# Patient Record
Sex: Female | Born: 1950
Health system: Southern US, Community
[De-identification: ages and names within clinical notes are randomized; demographics above are authoritative.]

## PROBLEM LIST (undated history)

## (undated) DIAGNOSIS — M5416 Radiculopathy, lumbar region: Secondary | ICD-10-CM

## (undated) DIAGNOSIS — F419 Anxiety disorder, unspecified: Secondary | ICD-10-CM

## (undated) DIAGNOSIS — I10 Essential (primary) hypertension: Secondary | ICD-10-CM

## (undated) DIAGNOSIS — G8929 Other chronic pain: Secondary | ICD-10-CM

## (undated) DIAGNOSIS — M549 Dorsalgia, unspecified: Secondary | ICD-10-CM

## (undated) DIAGNOSIS — M1990 Unspecified osteoarthritis, unspecified site: Secondary | ICD-10-CM

## (undated) DIAGNOSIS — E119 Type 2 diabetes mellitus without complications: Secondary | ICD-10-CM

## (undated) HISTORY — PX: ABDOMINAL HYSTERECTOMY: SHX81

---

## 2001-10-01 ENCOUNTER — Encounter: Payer: Self-pay | Admitting: Internal Medicine

## 2001-10-01 ENCOUNTER — Ambulatory Visit (HOSPITAL_COMMUNITY): Admission: RE | Admit: 2001-10-01 | Discharge: 2001-10-01 | Payer: Self-pay | Admitting: Internal Medicine

## 2002-02-14 ENCOUNTER — Ambulatory Visit (HOSPITAL_COMMUNITY): Admission: RE | Admit: 2002-02-14 | Discharge: 2002-02-14 | Payer: Self-pay | Admitting: Internal Medicine

## 2002-03-06 ENCOUNTER — Ambulatory Visit (HOSPITAL_COMMUNITY): Admission: RE | Admit: 2002-03-06 | Discharge: 2002-03-06 | Payer: Self-pay | Admitting: Internal Medicine

## 2002-04-03 ENCOUNTER — Ambulatory Visit (HOSPITAL_COMMUNITY): Admission: RE | Admit: 2002-04-03 | Discharge: 2002-04-03 | Payer: Self-pay | Admitting: Orthopaedic Surgery

## 2002-04-21 ENCOUNTER — Encounter (HOSPITAL_COMMUNITY): Admission: RE | Admit: 2002-04-21 | Discharge: 2002-05-21 | Payer: Self-pay | Admitting: Orthopaedic Surgery

## 2002-05-20 ENCOUNTER — Encounter (HOSPITAL_COMMUNITY): Admission: RE | Admit: 2002-05-20 | Discharge: 2002-06-19 | Payer: Self-pay | Admitting: Orthopaedic Surgery

## 2002-10-01 ENCOUNTER — Encounter: Payer: Self-pay | Admitting: Internal Medicine

## 2002-10-01 ENCOUNTER — Ambulatory Visit (HOSPITAL_COMMUNITY): Admission: RE | Admit: 2002-10-01 | Discharge: 2002-10-01 | Payer: Self-pay | Admitting: Internal Medicine

## 2003-03-06 ENCOUNTER — Encounter: Payer: Self-pay | Admitting: Internal Medicine

## 2003-03-06 ENCOUNTER — Ambulatory Visit (HOSPITAL_COMMUNITY): Admission: RE | Admit: 2003-03-06 | Discharge: 2003-03-06 | Payer: Self-pay | Admitting: Internal Medicine

## 2004-02-19 ENCOUNTER — Ambulatory Visit (HOSPITAL_COMMUNITY): Admission: RE | Admit: 2004-02-19 | Discharge: 2004-02-19 | Payer: Self-pay | Admitting: Internal Medicine

## 2005-02-19 ENCOUNTER — Emergency Department (HOSPITAL_COMMUNITY): Admission: EM | Admit: 2005-02-19 | Discharge: 2005-02-19 | Payer: Self-pay | Admitting: Emergency Medicine

## 2005-02-20 ENCOUNTER — Ambulatory Visit: Payer: Self-pay | Admitting: Orthopedic Surgery

## 2005-12-27 ENCOUNTER — Emergency Department (HOSPITAL_COMMUNITY): Admission: EM | Admit: 2005-12-27 | Discharge: 2005-12-27 | Payer: Self-pay | Admitting: Emergency Medicine

## 2006-06-17 ENCOUNTER — Emergency Department (HOSPITAL_COMMUNITY): Admission: EM | Admit: 2006-06-17 | Discharge: 2006-06-17 | Payer: Self-pay | Admitting: Emergency Medicine

## 2006-06-25 ENCOUNTER — Ambulatory Visit (HOSPITAL_COMMUNITY): Admission: RE | Admit: 2006-06-25 | Discharge: 2006-06-25 | Payer: Self-pay | Admitting: Internal Medicine

## 2007-06-11 ENCOUNTER — Ambulatory Visit (HOSPITAL_COMMUNITY): Admission: RE | Admit: 2007-06-11 | Discharge: 2007-06-11 | Payer: Self-pay | Admitting: Internal Medicine

## 2008-04-16 ENCOUNTER — Emergency Department (HOSPITAL_COMMUNITY): Admission: EM | Admit: 2008-04-16 | Discharge: 2008-04-16 | Payer: Self-pay | Admitting: Emergency Medicine

## 2008-04-23 ENCOUNTER — Encounter (HOSPITAL_COMMUNITY): Admission: RE | Admit: 2008-04-23 | Discharge: 2008-05-04 | Payer: Self-pay | Admitting: Internal Medicine

## 2008-06-11 ENCOUNTER — Ambulatory Visit (HOSPITAL_COMMUNITY): Admission: RE | Admit: 2008-06-11 | Discharge: 2008-06-11 | Payer: Self-pay | Admitting: Internal Medicine

## 2009-10-06 ENCOUNTER — Emergency Department (HOSPITAL_COMMUNITY): Admission: EM | Admit: 2009-10-06 | Discharge: 2009-10-06 | Payer: Self-pay | Admitting: Emergency Medicine

## 2009-10-20 ENCOUNTER — Ambulatory Visit (HOSPITAL_COMMUNITY): Admission: RE | Admit: 2009-10-20 | Discharge: 2009-10-20 | Payer: Self-pay | Admitting: Internal Medicine

## 2010-07-14 ENCOUNTER — Ambulatory Visit (HOSPITAL_COMMUNITY)
Admission: RE | Admit: 2010-07-14 | Discharge: 2010-07-14 | Payer: Self-pay | Source: Home / Self Care | Admitting: Internal Medicine

## 2010-07-16 ENCOUNTER — Encounter
Admission: RE | Admit: 2010-07-16 | Discharge: 2010-07-16 | Payer: Self-pay | Source: Home / Self Care | Attending: Internal Medicine | Admitting: Internal Medicine

## 2010-07-16 ENCOUNTER — Emergency Department (HOSPITAL_COMMUNITY)
Admission: EM | Admit: 2010-07-16 | Discharge: 2010-07-16 | Payer: Self-pay | Source: Home / Self Care | Admitting: Emergency Medicine

## 2010-08-07 HISTORY — PX: CARPAL TUNNEL RELEASE: SHX101

## 2010-12-20 NOTE — Procedures (Signed)
NAME:  Jocelyn Jackson, Jocelyn Jackson                 ACCOUNT NO.:  [id number]   MEDICAL RECORD NO.:  [id number]          PATIENT TYPE:  REC   LOCATION:  RAD                           FACILITY:  [facility]   PHYSICIAN:  Kingsley Callander. Ouida Sills, MD       DATE OF BIRTH:  1950-10-30   DATE OF PROCEDURE:  DATE OF DISCHARGE:                                  STRESS TEST   The patient underwent an Myoview stress test to evaluate recent symptoms  of chest pain.  She exercised 9 minutes (completing stage III of the  Bruce protocol) attaining a maximal heart rate of 176 (108% of the age  predicted maximal heart rate) at a workload of 10.1 METs and  discontinued exercise due to fatigue.  There are no symptoms of chest  pain.  There were no arrhythmias.  The baseline EKG revealed normal  sinus rhythm at 81 beats per minute.  She was hypertensive at 168/88 and  had a hypertensive response to exercise with a peak blood pressure of  238/92.  There were no ST-segment changes, diagnostic of ischemia.   IMPRESSION:  1. No evidence of exercise-induced ischemia.  Myoview images pending.  2. Resting hypertension.  3. Hypertensive exercise response.      Kingsley Callander. Ouida Sills, MD  Electronically Signed     ROF/MEDQ  D:  04/23/2008  T:  04/23/2008  Job:  [id number]

## 2010-12-23 NOTE — Op Note (Signed)
NAME:  TANISHA, LUTES                           ACCOUNT NO.:  [id number]   MEDICAL RECORD NO.:  [id number]                   PATIENT TYPE:  AMB   LOCATION:  DAY                                  FACILITY:  [facility]   PHYSICIAN:  J. Darreld Mclean, M.D.              DATE OF BIRTH:  04-04-1951   DATE OF PROCEDURE:  DATE OF DISCHARGE:                                 OPERATIVE REPORT   PREOPERATIVE DIAGNOSIS:  Carpal tunnel syndrome right.   POSTOPERATIVE DIAGNOSIS:  Carpal tunnel syndrome right.   PROCEDURE:  1. Open release carpal tunnel syndrome right.  2. Release of volar carpal ligament.  3. Saline neurolysis and epineurotomy.   SURGEON:  J. Darreld Mclean, M.D.   ASSISTANT:  Candace Cruise, [facility]   DRAINS:  None.   SPLINT:  Volar plastic splint applied.   INDICATIONS:  The patient is a 60 year old female who had a positive nerve  conduction velocities, EMG,  in 1999 in September.  At that time, carpal  tunnel right greater than left.  She elected not to have any surgery done.  She said that it was not that painful.  However, over the ensuing years it  has gotten progressively worse particularly over the last several months.  She is dropping things, and having significant night pain all over the  median nerve distribution.  Conservative treatment has been unsuccessful and  surgery is now recommended.  The risks and imponderables were discussed in  detail with the patient, she appears to understand and agreed to the  procedure as outlined.   DESCRIPTION OF PROCEDURE:  The patient placed supine on the operating room  table and Bier block anesthesia was given and table was attached.  The  patient was prepped and draped in the usual manner.  Prior to making an  incision we verified that this was Ms. Negash and we were doing the right  hand for carpal tunnel release.  Incision was made and careful dissection  and the nerves were identified proximally and distally and _________ nerve.   The volar carpal ligament was incised after a groove director placed in the  carpal tunnel space.  The patient's median nerve was obviously significantly  compressed.  Specimen of volar carpal ligament was sent to pathology.  Retinaculum cut proximally.  A saline neurolysis epineurotomy carried out.  No apparent injury to the nerve.  The wound then reapproximated using 3-0  nylon in interrupted vertical mattress manner.  A sterile dressing applied,  bulky dressing applied, sheet cotton applied.  Sheet cotton cut dorsally.  Volar plastic splint applied, and Ace  bandage applied loosely.  The patient was given a prescription for Darvocet-  N 100.  The patient returned in good condition.  If she has difficulty she  is to contact me through the office or hospital beeper system, otherwise see  me in my office in approximately 2 weeks.  Teola Bradley, M.D.    JWK/MEDQ  D:  04/03/2002  T:  04/04/2002  Job:  [id number]

## 2010-12-30 ENCOUNTER — Emergency Department (HOSPITAL_COMMUNITY)
Admission: EM | Admit: 2010-12-30 | Discharge: 2010-12-30 | Disposition: A | Discharge status: Home / Self Care | Payer: BC Managed Care – PPO | Attending: Emergency Medicine | Admitting: Emergency Medicine

## 2010-12-30 DIAGNOSIS — M5430 Sciatica, unspecified side: Secondary | ICD-10-CM | POA: Insufficient documentation

## 2010-12-30 DIAGNOSIS — Z79899 Other long term (current) drug therapy: Secondary | ICD-10-CM | POA: Insufficient documentation

## 2010-12-30 DIAGNOSIS — I10 Essential (primary) hypertension: Secondary | ICD-10-CM | POA: Insufficient documentation

## 2011-05-10 LAB — BASIC METABOLIC PANEL
Calcium: 9.2
Creatinine, Ser: 0.77
GFR calc Af Amer: >60
GFR calc non Af Amer: >60
Potassium: 2.9 — ABNORMAL LOW
Sodium: 138

## 2011-05-10 LAB — POCT CARDIAC MARKERS
CKMB, poc: 5.2
Myoglobin, poc: 82.1
Troponin i, poc: <0.05

## 2011-05-10 LAB — DIFFERENTIAL
Basophils Absolute: 0
Eosinophils Absolute: 0.1
Eosinophils Relative: 3
Lymphocytes Relative: 26
Monocytes Absolute: 0.5
Neutro Abs: 2.6

## 2011-05-10 LAB — CBC
HCT: 37.1
Hemoglobin: 13

## 2011-07-02 ENCOUNTER — Emergency Department (HOSPITAL_COMMUNITY): Payer: BC Managed Care – PPO

## 2011-07-02 ENCOUNTER — Emergency Department (HOSPITAL_COMMUNITY)
Admission: EM | Admit: 2011-07-02 | Discharge: 2011-07-02 | Disposition: A | Discharge status: Home / Self Care | Payer: BC Managed Care – PPO | Attending: Emergency Medicine | Admitting: Emergency Medicine

## 2011-07-02 DIAGNOSIS — J111 Influenza due to unidentified influenza virus with other respiratory manifestations: Secondary | ICD-10-CM | POA: Insufficient documentation

## 2011-07-02 DIAGNOSIS — Z9079 Acquired absence of other genital organ(s): Secondary | ICD-10-CM | POA: Insufficient documentation

## 2011-07-02 DIAGNOSIS — I10 Essential (primary) hypertension: Secondary | ICD-10-CM | POA: Insufficient documentation

## 2011-07-02 HISTORY — DX: Essential (primary) hypertension: I10

## 2011-07-02 MED ORDER — HYDROCOD POLST-CHLORPHEN POLST 10-8 MG/5ML PO LQCR
5.0000 mL | Freq: Once | ORAL | Status: AC
  Administered 2011-07-02: 5 mL via ORAL
  Filled 2011-07-02: qty 5

## 2011-07-02 MED ORDER — PREDNISONE 20 MG PO TABS
60.0000 mg | ORAL_TABLET | Freq: Once | ORAL | Status: AC
  Administered 2011-07-02: 60 mg via ORAL
  Filled 2011-07-02: qty 3

## 2011-07-02 MED ORDER — HYDROCOD POLST-CHLORPHEN POLST 10-8 MG/5ML PO LQCR: 5.0000 mL | Freq: Two times a day (BID) | ORAL | Status: DC

## 2011-07-02 MED ORDER — PREDNISONE 10 MG PO TABS: ORAL_TABLET | ORAL | Status: DC

## 2011-07-02 MED ORDER — ACETAMINOPHEN 500 MG PO TABS
1000.0000 mg | ORAL_TABLET | Freq: Once | ORAL | Status: AC
  Administered 2011-07-02: 1000 mg via ORAL
  Filled 2011-07-02: qty 2

## 2011-07-02 NOTE — ED Notes (Signed)
Pt presents with chills, headache, neck pain, sore throat and fever since 06/29/2011.

## 2011-07-03 NOTE — ED Provider Notes (Signed)
History     CSN: [id number] Arrival date & time: 07/02/2011  8:19 PM   First MD Initiated Contact with Patient 07/02/11 2021      Chief Complaint  Patient presents with  . Headache  . Neck Pain  . Chills    (Consider location/radiation/quality/duration/timing/severity/associated sxs/prior treatment) Patient is a 60 y.o. female presenting with headaches and neck pain. The history is provided by the patient.  Headache  The problem occurs constantly. The problem has not changed since onset.Associated with: fever. The pain is located in the frontal region. The quality of the pain is described as throbbing. The pain is moderate. The pain does not radiate. Associated symptoms include anorexia, a fever and malaise/fatigue. Pertinent negatives include no near-syncope, no palpitations, no syncope, no shortness of breath and no vomiting. Associated symptoms comments: Neck soreness. She has tried acetaminophen for the symptoms. The treatment provided no relief.  Neck Pain  Associated symptoms include headaches. Pertinent negatives include no photophobia, no chest pain and no syncope.    Past Medical History  Diagnosis Date  . Hypertension     Past Surgical History  Procedure Date  . Carpal tunnel release   . Abdominal hysterectomy     No family history on file.  History  Substance Use Topics  . Smoking status: Never Smoker   . Smokeless tobacco: Not on file  . Alcohol Use: No    OB History    Grav Para Term Preterm Abortions TAB SAB Ect Mult Living                  Review of Systems  Constitutional: Positive for fever and malaise/fatigue. Negative for activity change.       All ROS Neg except as noted in HPI  HENT: Positive for sore throat, neck pain and postnasal drip. Negative for nosebleeds.   Eyes: Negative for photophobia and discharge.  Respiratory: Negative for cough, shortness of breath and wheezing.   Cardiovascular: Negative for chest pain, palpitations, syncope  and near-syncope.  Gastrointestinal: Positive for anorexia. Negative for vomiting, abdominal pain and blood in stool.  Genitourinary: Negative for dysuria, frequency and hematuria.  Musculoskeletal: Positive for myalgias and arthralgias. Negative for back pain.  Skin: Negative.   Neurological: Positive for headaches. Negative for dizziness, seizures and speech difficulty.  Psychiatric/Behavioral: Negative for hallucinations and confusion.    Allergies  Review of patient's allergies indicates no known allergies.  Home Medications   Current Outpatient Rx  Name Route Sig Dispense Refill  . HYDROCOD POLST-CHLORPHEN POLST 10-8 MG/5ML PO LQCR Oral Take 5 mLs by mouth every 12 (twelve) hours. 140 mL 1  . PREDNISONE 10 MG PO TABS  6,5,4,3,2,1 - take with food 21 tablet 0    BP 166/72  Pulse 90  Temp(Src) 102.6 F (39.2 C) (Oral)  Resp 18  Ht 5\' 4"  (1.626 m)  Wt 130 lb (58.968 kg)  BMI 22.31 kg/m2  SpO2 100%  Physical Exam  Nursing note and vitals reviewed. Constitutional: She is oriented to person, place, and time. She appears well-developed and well-nourished.  Non-toxic appearance.  HENT:  Head: Normocephalic.  Right Ear: Tympanic membrane and external ear normal.  Left Ear: Tympanic membrane and external ear normal.       Nasal congestion present. Moderate increase redness of the posterior pharynx  Eyes: EOM and lids are normal. Pupils are equal, round, and reactive to light.  Neck: Normal range of motion. Neck supple. Carotid bruit is not present.  Soreness with ROM of the neck. No rigidity  Cardiovascular: Regular rhythm, normal heart sounds, intact distal pulses and normal pulses.  Tachycardia present.   Pulmonary/Chest: Breath sounds normal. No respiratory distress.  Abdominal: Soft. Bowel sounds are normal. There is no tenderness. There is no guarding.  Musculoskeletal: Normal range of motion.  Lymphadenopathy:       Head (right side): No submandibular adenopathy  present.       Head (left side): No submandibular adenopathy present.    She has no cervical adenopathy.  Neurological: She is alert and oriented to person, place, and time. She has normal strength. No cranial nerve deficit or sensory deficit.  Skin: Skin is warm and dry.  Psychiatric: She has a normal mood and affect. Her speech is normal.    ED Course  Procedures (including critical care time)   Labs Reviewed  RAPID STREP SCREEN   Dg Chest 2 View  07/02/2011  *RADIOLOGY REPORT*  Clinical Data:  Fever, cough and chills.  CHEST - 2 VIEW  Comparison: 04/16/2008  Findings: The heart size and mediastinal contours are within normal limits.  Both lungs are clear.  The visualized skeletal structures are unremarkable.  IMPRESSION: No active disease.  Original Report Authenticated By: Reola Calkins, M.D.     1. Influenza       MDM  I have reviewed nursing notes, vital signs, and all appropriate lab and imaging results for this patient.        Kathie Dike, [place] 07/03/11 [phone]

## 2011-07-03 NOTE — ED Provider Notes (Signed)
Medical screening examination/treatment/procedure(s) were performed by non-physician practitioner and as supervising physician I was immediately available for consultation/collaboration.   Hanley Seamen, MD 07/03/11 1500

## 2012-05-02 ENCOUNTER — Other Ambulatory Visit (HOSPITAL_COMMUNITY): Payer: Self-pay | Admitting: Internal Medicine

## 2012-05-02 DIAGNOSIS — Z139 Encounter for screening, unspecified: Secondary | ICD-10-CM

## 2012-05-07 ENCOUNTER — Ambulatory Visit (HOSPITAL_COMMUNITY)
Admission: RE | Admit: 2012-05-07 | Discharge: 2012-05-07 | Disposition: A | Discharge status: Home / Self Care | Payer: BC Managed Care – PPO | Source: Ambulatory Visit | Attending: Internal Medicine | Admitting: Internal Medicine

## 2012-05-07 DIAGNOSIS — Z139 Encounter for screening, unspecified: Secondary | ICD-10-CM

## 2012-05-07 DIAGNOSIS — Z1231 Encounter for screening mammogram for malignant neoplasm of breast: Secondary | ICD-10-CM | POA: Insufficient documentation

## 2013-06-24 ENCOUNTER — Other Ambulatory Visit (HOSPITAL_COMMUNITY): Payer: Self-pay | Admitting: Internal Medicine

## 2013-06-24 DIAGNOSIS — Z139 Encounter for screening, unspecified: Secondary | ICD-10-CM

## 2013-06-26 ENCOUNTER — Ambulatory Visit (HOSPITAL_COMMUNITY)
Admission: RE | Admit: 2013-06-26 | Discharge: 2013-06-26 | Disposition: A | Discharge status: Home / Self Care | Payer: BC Managed Care – PPO | Source: Ambulatory Visit | Attending: Internal Medicine | Admitting: Internal Medicine

## 2013-06-26 DIAGNOSIS — Z1231 Encounter for screening mammogram for malignant neoplasm of breast: Secondary | ICD-10-CM | POA: Insufficient documentation

## 2013-06-26 DIAGNOSIS — Z139 Encounter for screening, unspecified: Secondary | ICD-10-CM

## 2014-05-25 ENCOUNTER — Other Ambulatory Visit (HOSPITAL_COMMUNITY): Payer: Self-pay | Admitting: Internal Medicine

## 2014-05-25 DIAGNOSIS — Z1231 Encounter for screening mammogram for malignant neoplasm of breast: Secondary | ICD-10-CM

## 2014-06-29 ENCOUNTER — Ambulatory Visit (HOSPITAL_COMMUNITY)
Admission: RE | Admit: 2014-06-29 | Discharge: 2014-06-29 | Disposition: A | Discharge status: Home / Self Care | Payer: BC Managed Care – PPO | Source: Ambulatory Visit | Attending: Internal Medicine | Admitting: Internal Medicine

## 2014-06-29 DIAGNOSIS — Z1231 Encounter for screening mammogram for malignant neoplasm of breast: Secondary | ICD-10-CM | POA: Diagnosis not present

## 2014-12-07 ENCOUNTER — Ambulatory Visit: Payer: Self-pay | Admitting: Nutrition

## 2015-02-11 ENCOUNTER — Encounter (INDEPENDENT_AMBULATORY_CARE_PROVIDER_SITE_OTHER): Payer: Self-pay | Admitting: *Deleted

## 2015-02-17 ENCOUNTER — Other Ambulatory Visit (INDEPENDENT_AMBULATORY_CARE_PROVIDER_SITE_OTHER): Payer: Self-pay | Admitting: *Deleted

## 2015-02-17 ENCOUNTER — Encounter (INDEPENDENT_AMBULATORY_CARE_PROVIDER_SITE_OTHER): Payer: Self-pay | Admitting: *Deleted

## 2015-02-17 DIAGNOSIS — Z1211 Encounter for screening for malignant neoplasm of colon: Secondary | ICD-10-CM

## 2015-02-25 ENCOUNTER — Encounter: Payer: Self-pay | Admitting: Nutrition

## 2015-03-08 ENCOUNTER — Telehealth (INDEPENDENT_AMBULATORY_CARE_PROVIDER_SITE_OTHER): Payer: Self-pay | Admitting: *Deleted

## 2015-03-08 NOTE — Telephone Encounter (Signed)
Patient needs suprep 

## 2015-03-09 MED ORDER — SUPREP BOWEL PREP KIT 17.5-3.13-1.6 GM/177ML PO SOLN: 1.0000 | Freq: Once | ORAL | Status: DC

## 2015-04-06 ENCOUNTER — Telehealth (INDEPENDENT_AMBULATORY_CARE_PROVIDER_SITE_OTHER): Payer: Self-pay | Admitting: *Deleted

## 2015-04-06 NOTE — Telephone Encounter (Signed)
Referring MD/PCP: fagan   Procedure: tcs  Reason/Indication:  screening  Has patient had this procedure before?  Yes, years ago  If so, when, by whom and where?    Is there a family history of colon cancer?  no  Who?  What age when diagnosed?    Is patient diabetic?   no      Does patient have prosthetic heart valve?  no  Do you have a pacemaker?  no  Has patient ever had endocarditis? no  Has patient had joint replacement within last 12 months?  no  Does patient tend to be constipated or take laxatives? sometime  Does patient have a history of alcohol/drug use?   Is patient on Coumadin, Plavix and/or Aspirin? no  Medications: amlodipine 10 mg daily, losartan 50 mg daily, hctz 25 mg daily, potassium 20 mg bid, ibuprofen 800 mg tid  Allergies: nkda  Medication Adjustment:   Procedure date & time: 05/06/15 at 830

## 2015-04-08 NOTE — Telephone Encounter (Signed)
agree

## 2015-05-06 ENCOUNTER — Encounter (HOSPITAL_COMMUNITY)
Admission: RE | Disposition: A | Discharge status: Home / Self Care | Payer: Self-pay | Source: Ambulatory Visit | Attending: Internal Medicine

## 2015-05-06 ENCOUNTER — Encounter (HOSPITAL_COMMUNITY): Payer: Self-pay | Admitting: *Deleted

## 2015-05-06 ENCOUNTER — Ambulatory Visit (HOSPITAL_COMMUNITY)
Admission: RE | Admit: 2015-05-06 | Discharge: 2015-05-06 | Disposition: A | Discharge status: Home / Self Care | Payer: BLUE CROSS/BLUE SHIELD | Source: Ambulatory Visit | Attending: Internal Medicine | Admitting: Internal Medicine

## 2015-05-06 DIAGNOSIS — Z79899 Other long term (current) drug therapy: Secondary | ICD-10-CM | POA: Insufficient documentation

## 2015-05-06 DIAGNOSIS — K5730 Diverticulosis of large intestine without perforation or abscess without bleeding: Secondary | ICD-10-CM | POA: Insufficient documentation

## 2015-05-06 DIAGNOSIS — Z87891 Personal history of nicotine dependence: Secondary | ICD-10-CM | POA: Insufficient documentation

## 2015-05-06 DIAGNOSIS — Z1211 Encounter for screening for malignant neoplasm of colon: Secondary | ICD-10-CM | POA: Diagnosis not present

## 2015-05-06 DIAGNOSIS — I10 Essential (primary) hypertension: Secondary | ICD-10-CM | POA: Insufficient documentation

## 2015-05-06 HISTORY — PX: COLONOSCOPY: SHX5424

## 2015-05-06 SURGERY — COLONOSCOPY
Anesthesia: Moderate Sedation

## 2015-05-06 MED ORDER — MIDAZOLAM HCL 5 MG/5ML IJ SOLN
INTRAMUSCULAR | Status: AC
  Filled 2015-05-06: qty 10

## 2015-05-06 MED ORDER — SODIUM CHLORIDE 0.9 % IV SOLN
INTRAVENOUS | Status: DC
  Administered 2015-05-06: 08:00:00 via INTRAVENOUS

## 2015-05-06 MED ORDER — MEPERIDINE HCL 50 MG/ML IJ SOLN
INTRAMUSCULAR | Status: AC
  Filled 2015-05-06: qty 1

## 2015-05-06 MED ORDER — MIDAZOLAM HCL 5 MG/5ML IJ SOLN
INTRAMUSCULAR | Status: DC | PRN
  Administered 2015-05-06: 2 mg via INTRAVENOUS
  Administered 2015-05-06: 1 mg via INTRAVENOUS
  Administered 2015-05-06: 2 mg via INTRAVENOUS

## 2015-05-06 MED ORDER — STERILE WATER FOR IRRIGATION IR SOLN
Status: DC | PRN
  Administered 2015-05-06: 08:00:00

## 2015-05-06 MED ORDER — MEPERIDINE HCL 50 MG/ML IJ SOLN
INTRAMUSCULAR | Status: DC | PRN
  Administered 2015-05-06 (×2): 25 mg via INTRAVENOUS

## 2015-05-06 NOTE — H&P (Signed)
Jocelyn Jackson is an 64 y.o. female.   Chief Complaint: Patient is here for colonoscopy.Marland Kitchen HPI: Patient is 64 year old African-American female who is here for screening colonoscopy. She denies abdominal pain change in bowel habits or rectal bleeding. This is patient's first exam. Family History is negative for CRC.  Past Medical History  Diagnosis Date  . Hypertension     Past Surgical History  Procedure Laterality Date  . Carpal tunnel release    . Abdominal hysterectomy      History reviewed. No pertinent family history. Social History:  reports that she has quit smoking. Her smoking use included Cigarettes. She has a .125 pack-year smoking history. She does not have any smokeless tobacco history on file. She reports that she does not drink alcohol or use illicit drugs.  Allergies: No Known Allergies  Medications Prior to Admission  Medication Sig Dispense Refill  . hydrochlorothiazide (HYDRODIURIL) 25 MG tablet Take 25 mg by mouth daily.    Marland Kitchen ibuprofen (ADVIL,MOTRIN) 800 MG tablet Take 800 mg by mouth 3 (three) times daily as needed. pain    . losartan (COZAAR) 50 MG tablet Take 50 mg by mouth daily.    . potassium chloride SA (KLOR-CON M20) 20 MEQ tablet Take 20 mEq by mouth daily.    [place] Gunning BOWEL PREP SOLN Take 1 kit by mouth once. 1 Bottle 0  . chlorpheniramine-HYDROcodone (TUSSIONEX PENNKINETIC ER) 10-8 MG/5ML LQCR Take 5 mLs by mouth every 12 (twelve) hours. (Patient not taking: Reported on 04/26/2015) 140 mL 1  . predniSONE (DELTASONE) 10 MG tablet 6,5,4,3,2,1 - take with food (Patient not taking: Reported on 04/26/2015) 21 tablet 0    No results found for this or any previous visit (from the past 48 hour(s)). No results found.  ROS  Blood pressure 158/79, pulse 70, temperature 98.1 F (36.7 C), temperature source Oral, resp. rate 10, height '5\' 4"'  (1.626 m), weight 141 lb (63.957 kg), SpO2 100 %. Physical Exam  Constitutional: She appears well-developed and  well-nourished.  HENT:  Mouth/Throat: Oropharynx is clear and moist.  Eyes: Conjunctivae are normal. No scleral icterus.  Neck: No thyromegaly present.  Cardiovascular: Normal rate, regular rhythm and normal heart sounds.   No murmur heard. Respiratory: Effort normal and breath sounds normal.  GI: Soft. She exhibits no distension and no mass. There is no tenderness.  Musculoskeletal: She exhibits no edema.  Lymphadenopathy:    She has no cervical adenopathy.  Neurological: She is alert.  Skin: Skin is warm and dry.     Assessment/Plan Average risk screening colonoscopy.  REHMAN,NAJEEB U 05/06/2015, 8:22 AM

## 2015-05-06 NOTE — Op Note (Signed)
COLONOSCOPY PROCEDURE REPORT  PATIENT:  ALONNA BARTLING  MR#:  [id number] Birthdate:  07-11-1951, 64 y.o., female Endoscopist:  Dr. Malissa Hippo, MD Referred By:  Dr. Carylon Perches, MD Procedure Date: 05/06/2015  Procedure:   Colonoscopy  Indications:  Average risk screening colonoscopy. This is patient's first exam.  Informed Consent:  The procedure and risks were reviewed with the patient and informed consent was obtained.  Medications:  Demerol 50 mg IV Versed 5 mg IV  Description of procedure:  After a digital rectal exam was performed, that colonoscope was advanced from the anus through the rectum and colon to the area of the cecum, ileocecal valve and appendiceal orifice. The cecum was deeply intubated. These structures were well-seen and photographed for the record. From the level of the cecum and ileocecal valve, the scope was slowly and cautiously withdrawn. The mucosal surfaces were carefully surveyed utilizing scope tip to flexion to facilitate fold flattening as needed. The scope was pulled down into the rectum where a thorough exam including retroflexion was performed.  Findings:   Prep excellent. Two small diverticula noted at hepatic flexure. Normal rectal mucosa and anorectal junction.   Therapeutic/Diagnostic Maneuvers Performed:   None  Complications:  None  EBL: None  Cecal Withdrawal Time:  8 minutes  Impression:  Examination performed to cecum. Two small diverticula at hepatic flexure otherwise normal examination.  Recommendations:  Standard instructions given. Next screening exam in 10 years.  REHMAN,NAJEEB U  05/06/2015 8:59 AM  CC: Dr. Carylon Perches, MD & Dr. Bonnetta Barry ref. provider found

## 2015-05-06 NOTE — Discharge Instructions (Signed)
Resume usual medications and diet. °No driving for 24 hours. °Next screening exam in 10 years. ° ° °Colonoscopy, Care After °These instructions give you information on caring for yourself after your procedure. Your doctor may also give you more specific instructions. Call your doctor if you have any problems or questions after your procedure. °HOME CARE °· Do not drive for 24 hours. °· Do not sign important papers or use machinery for 24 hours. °· You may shower. °· You may go back to your usual activities, but go slower for the first 24 hours. °· Take rest breaks often during the first 24 hours. °· Walk around or use warm packs on your belly (abdomen) if you have belly cramping or gas. °· Drink enough fluids to keep your pee (urine) clear or pale yellow. °· Resume your normal diet. Avoid heavy or fried foods. °· Avoid drinking alcohol for 24 hours or as told by your doctor. °· Only take medicines as told by your doctor. °If a tissue sample (biopsy) was taken during the procedure:  °· Do not take aspirin or blood thinners for 7 days, or as told by your doctor. °· Do not drink alcohol for 7 days, or as told by your doctor. °· Eat soft foods for the first 24 hours. °GET HELP IF: °You still have a small amount of blood in your poop (stool) 2-3 days after the procedure. °GET HELP RIGHT AWAY IF: °· You have more than a small amount of blood in your poop. °· You see clumps of tissue (blood clots) in your poop. °· Your belly is puffy (swollen). °· You feel sick to your stomach (nauseous) or throw up (vomit). °· You have a fever. °· You have belly pain that gets worse and medicine does not help. °MAKE SURE YOU: °· Understand these instructions. °· Will watch your condition. °· Will get help right away if you are not doing well or get worse. °Document Released: 08/26/2010 Document Revised: 07/29/2013 Document Reviewed: 03/31/2013 °Exit[employer]® Patient Information ©2015 [employer]. This information is not intended to replace  advice given to you by your health care provider. Make sure you discuss any questions you have with your health care provider. ° °

## 2015-05-10 ENCOUNTER — Encounter (HOSPITAL_COMMUNITY): Payer: Self-pay | Admitting: Internal Medicine

## 2015-06-02 ENCOUNTER — Other Ambulatory Visit (HOSPITAL_COMMUNITY): Payer: Self-pay | Admitting: Internal Medicine

## 2015-06-02 DIAGNOSIS — Z1231 Encounter for screening mammogram for malignant neoplasm of breast: Secondary | ICD-10-CM

## 2015-07-05 ENCOUNTER — Ambulatory Visit (HOSPITAL_COMMUNITY)
Admission: RE | Admit: 2015-07-05 | Discharge: 2015-07-05 | Disposition: A | Discharge status: Home / Self Care | Payer: BLUE CROSS/BLUE SHIELD | Source: Ambulatory Visit | Attending: Internal Medicine | Admitting: Internal Medicine

## 2015-07-05 DIAGNOSIS — Z1231 Encounter for screening mammogram for malignant neoplasm of breast: Secondary | ICD-10-CM | POA: Insufficient documentation

## 2015-09-09 DIAGNOSIS — I10 Essential (primary) hypertension: Secondary | ICD-10-CM | POA: Diagnosis not present

## 2015-09-09 DIAGNOSIS — Z6827 Body mass index (BMI) 27.0-27.9, adult: Secondary | ICD-10-CM | POA: Diagnosis not present

## 2015-09-09 DIAGNOSIS — E119 Type 2 diabetes mellitus without complications: Secondary | ICD-10-CM | POA: Diagnosis not present

## 2015-09-13 ENCOUNTER — Encounter: Payer: Self-pay | Admitting: Obstetrics and Gynecology

## 2015-09-13 ENCOUNTER — Ambulatory Visit (INDEPENDENT_AMBULATORY_CARE_PROVIDER_SITE_OTHER): Payer: Medicare Other | Admitting: Obstetrics and Gynecology

## 2015-09-13 VITALS — BP 170/90 | Ht 64.0 in | Wt 142.5 lb

## 2015-09-13 DIAGNOSIS — Z90710 Acquired absence of both cervix and uterus: Secondary | ICD-10-CM | POA: Diagnosis not present

## 2015-09-13 DIAGNOSIS — Z0000 Encounter for general adult medical examination without abnormal findings: Secondary | ICD-10-CM

## 2015-09-13 NOTE — Progress Notes (Signed)
Patient ID: Jocelyn Jackson, female   DOB: 1951/06/02, 65 y.o.   MRN: [id number] Pt here today for annual exam. Pt denies any problems or concerns at this time.

## 2015-09-13 NOTE — Progress Notes (Signed)
Patient ID: Jocelyn Jackson, female   DOB: Jun 16, 1951, 65 y.o.   MRN: [id number]  Assessment:  Annual Gyn Exam 1. Normal exam statu post abd hysterectomy with removal of cervix, no future paps required Plan:   1. return prn 2.   Annual mammogram advised Subjective:  Jocelyn Jackson is a 65 y.o. female No obstetric history on file. who presents for annual exam. No LMP recorded. Patient has had a hysterectomy. The patient reports she has no complaints today. Pt confirms breast self-exam and denies any lumps or any other abnormalities. Has had colonoscopy 2016  The following portions of the patient's history were reviewed and updated as appropriate: allergies, current medications, past family history, past medical history, past social history, past surgical history and problem list. Past Medical History  Diagnosis Date  . Hypertension     Past Surgical History  Procedure Laterality Date  . Carpal tunnel release    . Abdominal hysterectomy    . Colonoscopy N/A 05/06/2015    Procedure: COLONOSCOPY;  Surgeon: Malissa Hippo, MD;  Location: [facility];  Service: Endoscopy;  Laterality: N/A;  830     Current outpatient prescriptions:  .  amLODipine (NORVASC) 10 MG tablet, Take 10 mg by mouth daily., Disp: , Rfl:  .  hydrochlorothiazide (HYDRODIURIL) 25 MG tablet, Take 25 mg by mouth daily., Disp: , Rfl:  .  ibuprofen (ADVIL,MOTRIN) 800 MG tablet, Take 800 mg by mouth 3 (three) times daily as needed. pain, Disp: , Rfl:  .  losartan (COZAAR) 50 MG tablet, Take 50 mg by mouth daily., Disp: , Rfl:  .  metFORMIN (GLUCOPHAGE) 500 MG tablet, , Disp: , Rfl: 11 .  potassium chloride SA (KLOR-CON M20) 20 MEQ tablet, Take 20 mEq by mouth daily., Disp: , Rfl:   Review of Systems Constitutional: negative followed by Dr Ouida Sills.who requested pt followup with Gyn . Gastrointestinal: negative Genitourinary: no bleeding pain or complaints. S  Objective:  BP 170/90 mmHg  Ht  (1.626 m)  Wt 142 lb 8  oz (64.638 kg)  BMI 24.45 kg/m2   BMI: Body mass index is 24.45 kg/(m^2).  General Appearance: Alert, appropriate appearance for age. No acute distress HEENT: Grossly normal ABD: Soft, no masses, non tender  Neck / Thyroid:  Cardiovascular: RRR; normal S1, S2, no murmur Lungs: CTA bilaterally Back: No CVAT Breast Exam: No dimpling, nipple retraction or discharge. No masses or nodes., Normal to inspection, Normal breast tissue bilaterally and No masses or nodes.No dimpling, nipple retraction or discharge. Gastrointestinal: Soft, non-tender, no masses or organomegaly Pelvic Exam: Vulva and vagina appear normal. Bimanual exam reveals normal uterus and adnexa. External genitalia: normal general appearance Urinary system: urethral meatus normal Vaginal: normal mucosa without prolapse or lesions, normal without tenderness, induration or masses and normal rugae Cervix: removed surgically Adnexa: non palpable Uterus: removed surgically Rectal: good sphincter tone, no masses and guaiac negative Rectovaginal: normal rectal, no masses Lymphatic Exam: Non-palpable nodes in neck, clavicular, axillary, or inguinal regions  Skin: no rash or abnormalities Neurologic: Normal gait and speech, no tremor  Psychiatric: Alert and oriented, appropriate affect.  Urinalysis:Not done  Christin Bach. MD Pgr [phone] 3:50 PM    By signing my name below, I, Marica Otter, attest that this documentation has been prepared under the direction and in the presence of Christin Bach, MD. Electronically Signed: Marica Otter, ED Scribe. 09/13/2015. 3:50 PM.   I personally performed the services described in this documentation, which was SCRIBED in  my presence. The recorded information has been reviewed and considered accurate. It has been edited as necessary during review. Jonnie Kind, MD

## 2015-10-15 ENCOUNTER — Emergency Department (HOSPITAL_COMMUNITY): Payer: Medicare Other

## 2015-10-15 ENCOUNTER — Emergency Department (HOSPITAL_COMMUNITY)
Admission: EM | Admit: 2015-10-15 | Discharge: 2015-10-15 | Disposition: A | Discharge status: Home / Self Care | Payer: Medicare Other | Attending: Emergency Medicine | Admitting: Emergency Medicine

## 2015-10-15 ENCOUNTER — Encounter (HOSPITAL_COMMUNITY): Payer: Self-pay | Admitting: Emergency Medicine

## 2015-10-15 DIAGNOSIS — I10 Essential (primary) hypertension: Secondary | ICD-10-CM | POA: Insufficient documentation

## 2015-10-15 DIAGNOSIS — M5412 Radiculopathy, cervical region: Secondary | ICD-10-CM | POA: Diagnosis not present

## 2015-10-15 DIAGNOSIS — Z79899 Other long term (current) drug therapy: Secondary | ICD-10-CM | POA: Insufficient documentation

## 2015-10-15 DIAGNOSIS — Z7984 Long term (current) use of oral hypoglycemic drugs: Secondary | ICD-10-CM | POA: Diagnosis not present

## 2015-10-15 DIAGNOSIS — M25512 Pain in left shoulder: Secondary | ICD-10-CM | POA: Diagnosis present

## 2015-10-15 DIAGNOSIS — Z87891 Personal history of nicotine dependence: Secondary | ICD-10-CM | POA: Diagnosis not present

## 2015-10-15 DIAGNOSIS — E119 Type 2 diabetes mellitus without complications: Secondary | ICD-10-CM | POA: Diagnosis not present

## 2015-10-15 HISTORY — DX: Type 2 diabetes mellitus without complications: E11.9

## 2015-10-15 HISTORY — DX: Other chronic pain: G89.29

## 2015-10-15 HISTORY — DX: Radiculopathy, lumbar region: M54.16

## 2015-10-15 HISTORY — DX: Dorsalgia, unspecified: M54.9

## 2015-10-15 MED ORDER — NAPROXEN 500 MG PO TABS: 500.0000 mg | ORAL_TABLET | Freq: Two times a day (BID) | ORAL | Status: DC

## 2015-10-15 MED ORDER — METHOCARBAMOL 500 MG PO TABS: 500.0000 mg | ORAL_TABLET | Freq: Four times a day (QID) | ORAL | Status: AC

## 2015-10-15 NOTE — ED Notes (Signed)
PA at bedside.

## 2015-10-15 NOTE — ED Provider Notes (Signed)
CSN: [id number]     Arrival date & time 10/15/15  4098 History  By signing my name below, I, Marica Otter, attest that this documentation has been prepared under the direction and in the presence of Burgess Amor, PA-C. Electronically Signed: Marica Otter, ED Scribe. 10/15/2015. 8:35 AM.  Chief Complaint  Patient presents with  . Shoulder Pain    left   The history is provided by the patient. No language interpreter was used.   PCP: Carylon Perches, MD HPI Comments: INDONESIA MCKEOUGH is a 65 y.o. female, with PMHx noted below, who presents to the Emergency Department complaining of sudden onset, atraumatic, 10/10, sore/stinging left shoulder pain radiating down her left arm and to the left side of her neck onset 3 weeks ago. Associated Sx include tingling sensation in left shoulder radiating down the left arm; pt denies any tingling in the hands. Aggravating factors include movement. Pt reports using advil and ibuprofen at home without relief. Pt denies any recent neck injuries. Pt further denies SOB, chest pain, n/v, diaphoresis, headache, weakness or any other Sx at this time.   Past Medical History  Diagnosis Date  . Hypertension   . Diabetes mellitus without complication (HCC)   . Chronic back pain   . Lumbar radiculopathy    Past Surgical History  Procedure Laterality Date  . Carpal tunnel release  2012    keeling  . Abdominal hysterectomy      cervix removed  . Colonoscopy N/A 05/06/2015    Procedure: COLONOSCOPY;  Surgeon: Malissa Hippo, MD;  Location: [facility];  Service: Endoscopy;  Laterality: N/A;  830   Family History  Problem Relation Age of Onset  . Heart disease Mother   . Hypertension Mother   . Diabetes Mother   . Heart disease Father   . Hypertension Father   . Diabetes Father   . Cancer Sister   . Hypertension Brother   . Hypertension Sister   . Hypertension Sister    Social History  Substance Use Topics  . Smoking status: Former Smoker -- 0.25 packs/day for  .5 years    Types: Cigarettes  . Smokeless tobacco: Never Used  . Alcohol Use: No   OB History    No data available     Review of Systems  Constitutional: Negative for diaphoresis, appetite change and fatigue.  HENT: Negative for congestion, ear discharge and sinus pressure.   Eyes: Negative for discharge.  Respiratory: Negative for cough and shortness of breath.   Cardiovascular: Negative for chest pain.  Gastrointestinal: Negative for nausea, vomiting, abdominal pain and diarrhea.  Genitourinary: Negative for frequency and hematuria.  Musculoskeletal: Positive for arthralgias (left shoulder pain radiating left arm pain) and neck pain (left sided ). Negative for back pain.  Skin: Negative for rash.  Neurological: Positive for numbness (tingling sensation in left arm). Negative for seizures, weakness and headaches.  Psychiatric/Behavioral: Negative for hallucinations.   Allergies  Review of patient's allergies indicates no known allergies.  Home Medications   Prior to Admission medications   Medication Sig Start Date End Date Taking? Authorizing Provider  amLODipine (NORVASC) 10 MG tablet Take 10 mg by mouth daily.    Historical Provider, MD  hydrochlorothiazide (HYDRODIURIL) 25 MG tablet Take 25 mg by mouth daily. 03/29/12   Historical Provider, MD  ibuprofen (ADVIL,MOTRIN) 800 MG tablet Take 800 mg by mouth 3 (three) times daily as needed. pain 07/25/13   Historical Provider, MD  losartan (COZAAR) 50 MG tablet Take  50 mg by mouth daily. 01/29/15   Historical Provider, MD  metFORMIN (GLUCOPHAGE) 500 MG tablet  09/10/15   Historical Provider, MD  methocarbamol (ROBAXIN) 500 MG tablet Take 1 tablet (500 mg total) by mouth 4 (four) times daily. 10/15/15 10/25/15  Burgess Amor, PA-C  naproxen (NAPROSYN) 500 MG tablet Take 1 tablet (500 mg total) by mouth 2 (two) times daily. 10/15/15   Burgess Amor, PA-C  potassium chloride SA (KLOR-CON M20) 20 MEQ tablet Take 20 mEq by mouth daily. 10/19/14    Historical Provider, MD   Triage Vitals: BP 137/73 mmHg  Pulse 89  Temp(Src) 98 F (36.7 C) (Oral)  Resp 16  Ht  (1.626 m)  Wt 142 lb (64.411 kg)  BMI 24.36 kg/m2  SpO2 100% Physical Exam  Constitutional: She is oriented to person, place, and time. She appears well-developed and well-nourished.  HENT:  Head: Normocephalic.  Eyes: EOM are normal.  Neck: Normal range of motion.  Cardiovascular:  Pulses:      Radial pulses are 2+ on the right side, and 2+ on the left side.  Pulmonary/Chest: Effort normal.  Abdominal: She exhibits no distension.  Musculoskeletal: Normal range of motion.       Cervical back: She exhibits no bony tenderness.  Left paracervical/trapezius muscle tenderness. No palpable spasm. FROM of Cspine without worsened pain or ridiculopathy.  Flex/ext with FROM of elbow and wrist.  Neurological: She is alert and oriented to person, place, and time.  Reflex Scores:      Bicep reflexes are 2+ on the right side and 1+ on the left side. Full grip strength, full and normal distal sensation in upper extremities.   Psychiatric: She has a normal mood and affect.  Nursing note and vitals reviewed.  ED Course  Procedures (including critical care time) DIAGNOSTIC STUDIES: Oxygen Saturation is 100% on ra, nl by my interpretation.    COORDINATION OF CARE: 8:17 AM: Discussed treatment plan which includes imaging, muscle relaxer, antiinflammatory, heating pad use, with pt at bedside; patient verbalizes understanding and agrees with treatment plan except states she is unable to use the heating pad because it makes her "want to pass out."   Imaging Review Dg Cervical Spine Complete  10/15/2015  CLINICAL DATA:  Left-sided neck pain into the left shoulder that radiates down the left arm. EXAM: CERVICAL SPINE - COMPLETE 4+ VIEW COMPARISON:  None. FINDINGS: Grade 1 anterolisthesis at C4-C5. Disc space narrowing at C5-C6. Normal alignment at the cervicothoracic junction.  Prevertebral soft tissues are normal. Bony encroachment of the right neural foramen at C5-C6 and C6-C7, this is most prominent at C6-C7. Bony encroachment of the left neural foramen at C3-C4 and C4-C5, most prominent at C4-C5. No evidence for a fracture. Evidence for bilateral facet arthropathy. IMPRESSION: Degenerative disease in the cervical spine. Bony encroachment of the neural foramen bilaterally. This is most prominent along the left side of C4-C5 and could be contributing to the patient's symptoms. Electronically Signed   By: Richarda Overlie M.D.   On: 10/15/2015 08:49   I have personally reviewed and evaluated these images as part of my medical decision-making.   MDM   Final diagnoses:  Cervical radiculopathy    Robaxin, ibuprofen, localized heat. followup with neurosurgery, referral given.    No neuro deficit on exam or by history to suggest emergent or surgical presentation, motor and strength intact.  Also discussed worsened sx that should prompt immediate re-evaluation including distal weakness.    I personally performed  the services described in this documentation, which was scribed in my presence. The recorded information has been reviewed and is accurate.   Adisa Vigeant, PA-C 10/15/15 0908  BurgesBurgess Amors AmorJulie Sheritta Deeg, PA-C 10/15/15 1134  Samuel JesterKathleen McManus, DO 10/18/15 [phone]

## 2015-10-15 NOTE — Discharge Instructions (Signed)

## 2015-10-15 NOTE — ED Notes (Signed)
C/o of left shoulder pain for 3 weeks.  Pain is constant, rates pain 10/10.  Denies any other pain at this time.

## 2015-10-22 DIAGNOSIS — M5023 Other cervical disc displacement, cervicothoracic region: Secondary | ICD-10-CM | POA: Diagnosis not present

## 2015-10-22 DIAGNOSIS — M5412 Radiculopathy, cervical region: Secondary | ICD-10-CM | POA: Diagnosis not present

## 2015-11-11 DIAGNOSIS — M5412 Radiculopathy, cervical region: Secondary | ICD-10-CM | POA: Diagnosis not present

## 2015-11-11 DIAGNOSIS — M5023 Other cervical disc displacement, cervicothoracic region: Secondary | ICD-10-CM | POA: Diagnosis not present

## 2015-11-11 DIAGNOSIS — M542 Cervicalgia: Secondary | ICD-10-CM | POA: Diagnosis not present

## 2015-11-12 ENCOUNTER — Other Ambulatory Visit: Payer: Self-pay | Admitting: Neurosurgery

## 2015-11-12 DIAGNOSIS — M5023 Other cervical disc displacement, cervicothoracic region: Secondary | ICD-10-CM

## 2015-11-23 ENCOUNTER — Ambulatory Visit
Admission: RE | Admit: 2015-11-23 | Discharge: 2015-11-23 | Disposition: A | Discharge status: Home / Self Care | Payer: Medicare Other | Source: Ambulatory Visit | Attending: Neurosurgery | Admitting: Neurosurgery

## 2015-11-23 DIAGNOSIS — M5023 Other cervical disc displacement, cervicothoracic region: Secondary | ICD-10-CM

## 2015-11-23 DIAGNOSIS — M50221 Other cervical disc displacement at C4-C5 level: Secondary | ICD-10-CM | POA: Diagnosis not present

## 2015-12-14 DIAGNOSIS — G959 Disease of spinal cord, unspecified: Secondary | ICD-10-CM | POA: Diagnosis not present

## 2015-12-17 ENCOUNTER — Other Ambulatory Visit: Payer: Self-pay | Admitting: Neurosurgery

## 2015-12-28 DIAGNOSIS — M542 Cervicalgia: Secondary | ICD-10-CM | POA: Diagnosis not present

## 2016-01-05 ENCOUNTER — Encounter (HOSPITAL_COMMUNITY)
Admission: RE | Admit: 2016-01-05 | Discharge: 2016-01-05 | Disposition: A | Discharge status: Home / Self Care | Payer: Medicare Other | Source: Ambulatory Visit | Attending: Neurosurgery | Admitting: Neurosurgery

## 2016-01-05 ENCOUNTER — Other Ambulatory Visit (HOSPITAL_COMMUNITY): Payer: Self-pay | Admitting: *Deleted

## 2016-01-05 ENCOUNTER — Emergency Department (HOSPITAL_COMMUNITY)
Admission: EM | Admit: 2016-01-05 | Discharge: 2016-01-05 | Disposition: A | Discharge status: Other Acute Inpatient Hospital | Payer: Medicare Other

## 2016-01-05 ENCOUNTER — Encounter (HOSPITAL_COMMUNITY): Payer: Self-pay

## 2016-01-05 DIAGNOSIS — G959 Disease of spinal cord, unspecified: Secondary | ICD-10-CM | POA: Insufficient documentation

## 2016-01-05 DIAGNOSIS — Z01818 Encounter for other preprocedural examination: Secondary | ICD-10-CM | POA: Diagnosis not present

## 2016-01-05 DIAGNOSIS — Z01812 Encounter for preprocedural laboratory examination: Secondary | ICD-10-CM | POA: Insufficient documentation

## 2016-01-05 HISTORY — DX: Unspecified osteoarthritis, unspecified site: M19.90

## 2016-01-05 HISTORY — DX: Anxiety disorder, unspecified: F41.9

## 2016-01-05 LAB — BASIC METABOLIC PANEL
ANION GAP: 6 (ref 5–15)
BUN: 9 mg/dL (ref 6–20)
CALCIUM: 9.6 mg/dL (ref 8.9–10.3)
CO2: 26 mmol/L (ref 22–32)
CREATININE: 0.74 mg/dL (ref 0.44–1.00)
Chloride: 106 mmol/L (ref 101–111)
GFR calc non Af Amer: >60 mL/min (ref >60)
GLUCOSE: 96 mg/dL (ref 65–99)
Potassium: 3.6 mmol/L (ref 3.5–5.1)
Sodium: 138 mmol/L (ref 135–145)

## 2016-01-05 LAB — CBC
HEMATOCRIT: 34.9 % — AB (ref 36.0–46.0)
Hemoglobin: 11.6 g/dL — ABNORMAL LOW (ref 12.0–15.0)
MCH: 27.6 pg (ref 26.0–34.0)
MCHC: 33.2 g/dL (ref 30.0–36.0)
MCV: 83.1 fL (ref 78.0–100.0)
Platelets: 249 10*3/uL (ref 150–400)
RBC: 4.2 MIL/uL (ref 3.87–5.11)
RDW: 13.3 % (ref 11.5–15.5)
WBC: 5.2 10*3/uL (ref 4.0–10.5)

## 2016-01-05 LAB — GLUCOSE, CAPILLARY: GLUCOSE-CAPILLARY: 105 mg/dL — AB (ref 65–99)

## 2016-01-05 LAB — SURGICAL PCR SCREEN
MRSA, PCR: NEGATIVE
STAPHYLOCOCCUS AUREUS: NEGATIVE

## 2016-01-05 NOTE — Pre-Procedure Instructions (Addendum)
Jocelyn Jackson  01/05/2016      [facility] - [place], [place] - [address] [place] [address] Phone: [phone] Fax: [phone]    Your procedure is scheduled on 01-12-2016  Wednesday    [facility] Ad[facility] at 11:00 A.M.   Call this number if you have problems the morning of surgery:  [phone]   Remember:  Do not eat food or drink liquids after midnight.   Take these medicines the morning of surgery with A SIP OF WATER amlodipine(Norvasc),atorvastatin(Lipitor),Potassium Chloride(Klor-Con)               Stop all blood thinners,aspirin,ibuprofen,naproxen,advil.aleve, Goody's powders,vitamins,and herbal Supplements 5 days prior to surgery               How to Manage Your Diabetes Before and After Surgery  Why is it important to control my blood sugar before and after surgery? . Improving blood sugar levels before and after surgery helps healing and can limit problems. . A way of improving blood sugar control is eating a healthy diet by: o  Eating less sugar and carbohydrates o  Increasing activity/exercise o  Talking with your doctor about reaching your blood sugar goals . High blood sugars (greater than 180 mg/dL) can raise your risk of infections and slow your recovery, so you will need to focus on controlling your diabetes during the weeks before surgery. . Make sure that the doctor who takes care of your diabetes knows about your planned surgery including the date and location.  How do I manage my blood sugar before surgery? . Check your blood sugar at least 4 times a day, starting 2 days before surgery, to make sure that the level is not too high or low. o Check your blood sugar the morning of your surgery when you wake up and every 2 hours until you get to the Short Stay unit. . If your blood sugar is less than 70 mg/dL, you will need to treat for low blood sugar: o Do not take insulin. o Treat a low  blood sugar (less than 70 mg/dL) with  cup of clear juice (cranberry or apple), 4 glucose tablets, OR glucose gel. o Recheck blood sugar in 15 minutes after treatment (to make sure it is greater than 70 mg/dL). If your blood sugar is not greater than 70 mg/dL on recheck, call [phone] for further instructions. . Report your blood sugar to the short stay nurse when you get to Short Stay.  . If you are admitted to the hospital after surgery: o Your blood sugar will be checked by the staff and you will probably be given insulin after surgery (instead of oral diabetes medicines) to make sure you have good blood sugar levels. o The goal for blood sugar control after surgery is 80-180 mg/dL.              WHAT DO I DO ABOUT MY DIABETES MEDICATION?   Marland Kitchen. Do not take oral diabetes medicines (pills) the morning of surgery.     Reviewed and Endorsed by [facility] Patient Education Committee, August 2015    Do not wear jewelry, make-up or nail polish.  Do not wear lotions, powders, or perfumes.  You may NOT wear deodorant.  Do not shave 48 hours prior to surgery.    Do not bring valuables to the hospital.  [facility] is not responsible for any belongings or valuables.  Contacts,  dentures or bridgework may not be worn into surgery.  Leave your suitcase in the car.  After surgery it may be brought to your room.  For patients admitted to the hospital, discharge time will be determined by your treatment team.  Patients discharged the day of surgery will not be allowed to drive home.    Special instructions:  See attached Sheet for instructions on CHG showers  Please read over the following fact sheets that you were given. Surgical Site Infection Prevention

## 2016-01-05 NOTE — ED Notes (Signed)
Here for pre-op bloodwork sent to short stay

## 2016-01-06 LAB — HEMOGLOBIN A1C
Hgb A1c MFr Bld: 7.3 % — ABNORMAL HIGH (ref 4.8–5.6)
MEAN PLASMA GLUCOSE: 163 mg/dL

## 2016-01-11 MED ORDER — DEXAMETHASONE SODIUM PHOSPHATE 10 MG/ML IJ SOLN
10.0000 mg | INTRAMUSCULAR | Status: AC
  Administered 2016-01-12: 10 mg via INTRAVENOUS
  Filled 2016-01-11: qty 1

## 2016-01-11 MED ORDER — CEFAZOLIN SODIUM-DEXTROSE 2-4 GM/100ML-% IV SOLN
2.0000 g | INTRAVENOUS | Status: AC
  Administered 2016-01-12: 2 g via INTRAVENOUS
  Filled 2016-01-11: qty 100

## 2016-01-12 ENCOUNTER — Inpatient Hospital Stay (HOSPITAL_COMMUNITY): Payer: Medicare Other

## 2016-01-12 ENCOUNTER — Other Ambulatory Visit (HOSPITAL_COMMUNITY): Payer: Medicare Other

## 2016-01-12 ENCOUNTER — Inpatient Hospital Stay (HOSPITAL_COMMUNITY): Payer: Medicare Other | Admitting: Certified Registered Nurse Anesthetist

## 2016-01-12 ENCOUNTER — Inpatient Hospital Stay (HOSPITAL_COMMUNITY)
Admission: RE | Admit: 2016-01-12 | Discharge: 2016-01-14 | DRG: 473 | Disposition: A | Discharge status: Home / Self Care | Payer: Medicare Other | Source: Ambulatory Visit | Attending: Neurosurgery | Admitting: Neurosurgery

## 2016-01-12 ENCOUNTER — Encounter (HOSPITAL_COMMUNITY): Payer: Self-pay | Admitting: Certified Registered Nurse Anesthetist

## 2016-01-12 ENCOUNTER — Encounter (HOSPITAL_COMMUNITY)
Admission: RE | Disposition: A | Discharge status: Home / Self Care | Payer: Self-pay | Source: Ambulatory Visit | Attending: Neurosurgery

## 2016-01-12 DIAGNOSIS — M4802 Spinal stenosis, cervical region: Secondary | ICD-10-CM | POA: Diagnosis not present

## 2016-01-12 DIAGNOSIS — I10 Essential (primary) hypertension: Secondary | ICD-10-CM | POA: Diagnosis not present

## 2016-01-12 DIAGNOSIS — M50021 Cervical disc disorder at C4-C5 level with myelopathy: Secondary | ICD-10-CM | POA: Diagnosis not present

## 2016-01-12 DIAGNOSIS — M5001 Cervical disc disorder with myelopathy,  high cervical region: Secondary | ICD-10-CM | POA: Diagnosis not present

## 2016-01-12 DIAGNOSIS — M4712 Other spondylosis with myelopathy, cervical region: Secondary | ICD-10-CM | POA: Diagnosis not present

## 2016-01-12 DIAGNOSIS — G8929 Other chronic pain: Secondary | ICD-10-CM | POA: Diagnosis present

## 2016-01-12 DIAGNOSIS — Z888 Allergy status to other drugs, medicaments and biological substances status: Secondary | ICD-10-CM | POA: Diagnosis not present

## 2016-01-12 DIAGNOSIS — E119 Type 2 diabetes mellitus without complications: Secondary | ICD-10-CM | POA: Diagnosis not present

## 2016-01-12 DIAGNOSIS — Z87891 Personal history of nicotine dependence: Secondary | ICD-10-CM | POA: Diagnosis not present

## 2016-01-12 DIAGNOSIS — M549 Dorsalgia, unspecified: Secondary | ICD-10-CM | POA: Diagnosis not present

## 2016-01-12 DIAGNOSIS — Z8249 Family history of ischemic heart disease and other diseases of the circulatory system: Secondary | ICD-10-CM | POA: Diagnosis not present

## 2016-01-12 DIAGNOSIS — Z79899 Other long term (current) drug therapy: Secondary | ICD-10-CM | POA: Diagnosis not present

## 2016-01-12 DIAGNOSIS — M25512 Pain in left shoulder: Secondary | ICD-10-CM | POA: Diagnosis present

## 2016-01-12 DIAGNOSIS — Z981 Arthrodesis status: Secondary | ICD-10-CM | POA: Diagnosis not present

## 2016-01-12 DIAGNOSIS — Z419 Encounter for procedure for purposes other than remedying health state, unspecified: Secondary | ICD-10-CM

## 2016-01-12 DIAGNOSIS — Z7984 Long term (current) use of oral hypoglycemic drugs: Secondary | ICD-10-CM | POA: Diagnosis not present

## 2016-01-12 DIAGNOSIS — F419 Anxiety disorder, unspecified: Secondary | ICD-10-CM | POA: Diagnosis not present

## 2016-01-12 HISTORY — PX: ANTERIOR CERVICAL DECOMP/DISCECTOMY FUSION: SHX1161

## 2016-01-12 LAB — GLUCOSE, CAPILLARY
Glucose-Capillary: 101 mg/dL — ABNORMAL HIGH (ref 65–99)
Glucose-Capillary: 203 mg/dL — ABNORMAL HIGH (ref 65–99)
Glucose-Capillary: 241 mg/dL — ABNORMAL HIGH (ref 65–99)

## 2016-01-12 SURGERY — ANTERIOR CERVICAL DECOMPRESSION/DISCECTOMY FUSION 3 LEVELS
Anesthesia: General | Site: Spine Cervical

## 2016-01-12 MED ORDER — INSULIN ASPART 100 UNIT/ML ~~LOC~~ SOLN
0.0000 [IU] | Freq: Every day | SUBCUTANEOUS | Status: DC
  Administered 2016-01-12: 2 [IU] via SUBCUTANEOUS

## 2016-01-12 MED ORDER — AMLODIPINE BESYLATE 10 MG PO TABS
10.0000 mg | ORAL_TABLET | Freq: Every day | ORAL | Status: DC
  Administered 2016-01-13 – 2016-01-14 (×2): 10 mg via ORAL
  Filled 2016-01-12 (×2): qty 1

## 2016-01-12 MED ORDER — 0.9 % SODIUM CHLORIDE (POUR BTL) OPTIME
TOPICAL | Status: DC | PRN
  Administered 2016-01-12: 1000 mL

## 2016-01-12 MED ORDER — MIDAZOLAM HCL 5 MG/5ML IJ SOLN
INTRAMUSCULAR | Status: DC | PRN
  Administered 2016-01-12: 2 mg via INTRAVENOUS

## 2016-01-12 MED ORDER — NAPROXEN 250 MG PO TABS
500.0000 mg | ORAL_TABLET | Freq: Two times a day (BID) | ORAL | Status: DC
  Administered 2016-01-12 – 2016-01-14 (×4): 500 mg via ORAL
  Filled 2016-01-12 (×2): qty 1
  Filled 2016-01-12 (×2): qty 2

## 2016-01-12 MED ORDER — SODIUM CHLORIDE 0.9 % IV SOLN: 250.0000 mL | INTRAVENOUS | Status: DC

## 2016-01-12 MED ORDER — PHENOL 1.4 % MT LIQD
1.0000 | OROMUCOSAL | Status: DC | PRN
  Administered 2016-01-12: 1 via OROMUCOSAL
  Filled 2016-01-12: qty 177

## 2016-01-12 MED ORDER — POTASSIUM CHLORIDE CRYS ER 20 MEQ PO TBCR
20.0000 meq | EXTENDED_RELEASE_TABLET | Freq: Every day | ORAL | Status: DC
  Administered 2016-01-13 – 2016-01-14 (×2): 20 meq via ORAL
  Filled 2016-01-12 (×2): qty 1

## 2016-01-12 MED ORDER — SODIUM CHLORIDE 0.9 % IR SOLN
Status: DC | PRN
  Administered 2016-01-12: 14:00:00

## 2016-01-12 MED ORDER — MIDAZOLAM HCL 2 MG/2ML IJ SOLN
INTRAMUSCULAR | Status: AC
  Filled 2016-01-12: qty 2

## 2016-01-12 MED ORDER — ONDANSETRON HCL 4 MG/2ML IJ SOLN
4.0000 mg | INTRAMUSCULAR | Status: DC | PRN
  Administered 2016-01-12: 4 mg via INTRAVENOUS
  Filled 2016-01-12: qty 2

## 2016-01-12 MED ORDER — GELATIN ABSORBABLE MT POWD
OROMUCOSAL | Status: DC | PRN
  Administered 2016-01-12: 15:00:00 via TOPICAL

## 2016-01-12 MED ORDER — FENTANYL CITRATE (PF) 250 MCG/5ML IJ SOLN
INTRAMUSCULAR | Status: AC
  Filled 2016-01-12: qty 5

## 2016-01-12 MED ORDER — ALBUTEROL SULFATE HFA 108 (90 BASE) MCG/ACT IN AERS
INHALATION_SPRAY | RESPIRATORY_TRACT | Status: DC | PRN
  Administered 2016-01-12: 2 via RESPIRATORY_TRACT

## 2016-01-12 MED ORDER — SUGAMMADEX SODIUM 200 MG/2ML IV SOLN
INTRAVENOUS | Status: DC | PRN
  Administered 2016-01-12: 150 mg via INTRAVENOUS

## 2016-01-12 MED ORDER — PROPOFOL 10 MG/ML IV BOLUS
INTRAVENOUS | Status: AC
  Filled 2016-01-12: qty 20

## 2016-01-12 MED ORDER — HYDROCHLOROTHIAZIDE 25 MG PO TABS
25.0000 mg | ORAL_TABLET | Freq: Every day | ORAL | Status: DC
  Administered 2016-01-13 – 2016-01-14 (×2): 25 mg via ORAL
  Filled 2016-01-12 (×2): qty 1

## 2016-01-12 MED ORDER — OXYCODONE-ACETAMINOPHEN 5-325 MG PO TABS
1.0000 | ORAL_TABLET | ORAL | Status: DC | PRN
  Administered 2016-01-13 (×2): 1 via ORAL
  Filled 2016-01-12 (×2): qty 1

## 2016-01-12 MED ORDER — EPHEDRINE SULFATE 50 MG/ML IJ SOLN
INTRAMUSCULAR | Status: DC | PRN
  Administered 2016-01-12 (×3): 5 mg via INTRAVENOUS

## 2016-01-12 MED ORDER — SODIUM CHLORIDE 0.9% FLUSH: 3.0000 mL | INTRAVENOUS | Status: DC | PRN

## 2016-01-12 MED ORDER — CEFAZOLIN SODIUM-DEXTROSE 2-4 GM/100ML-% IV SOLN
2.0000 g | Freq: Three times a day (TID) | INTRAVENOUS | Status: DC
  Administered 2016-01-12 – 2016-01-13 (×2): 2 g via INTRAVENOUS
  Filled 2016-01-12 (×2): qty 100

## 2016-01-12 MED ORDER — CYCLOBENZAPRINE HCL 10 MG PO TABS
10.0000 mg | ORAL_TABLET | Freq: Three times a day (TID) | ORAL | Status: DC | PRN
  Administered 2016-01-13: 10 mg via ORAL
  Filled 2016-01-12: qty 1

## 2016-01-12 MED ORDER — ACETAMINOPHEN 650 MG RE SUPP: 650.0000 mg | RECTAL | Status: DC | PRN

## 2016-01-12 MED ORDER — MENTHOL 3 MG MT LOZG
1.0000 | LOZENGE | OROMUCOSAL | Status: DC | PRN
  Filled 2016-01-12: qty 9

## 2016-01-12 MED ORDER — THROMBIN 20000 UNITS EX SOLR
CUTANEOUS | Status: DC | PRN
  Administered 2016-01-12: 15:00:00 via TOPICAL

## 2016-01-12 MED ORDER — ACETAMINOPHEN 325 MG PO TABS: 650.0000 mg | ORAL_TABLET | ORAL | Status: DC | PRN

## 2016-01-12 MED ORDER — HYDROXYZINE HCL 50 MG/ML IM SOLN
50.0000 mg | Freq: Four times a day (QID) | INTRAMUSCULAR | Status: DC | PRN
  Administered 2016-01-12: 50 mg via INTRAMUSCULAR
  Filled 2016-01-12: qty 1

## 2016-01-12 MED ORDER — METFORMIN HCL 500 MG PO TABS
500.0000 mg | ORAL_TABLET | Freq: Every day | ORAL | Status: DC
  Administered 2016-01-13 – 2016-01-14 (×2): 500 mg via ORAL
  Filled 2016-01-12 (×2): qty 1

## 2016-01-12 MED ORDER — ATORVASTATIN CALCIUM 20 MG PO TABS
10.0000 mg | ORAL_TABLET | Freq: Every day | ORAL | Status: DC
  Administered 2016-01-12 – 2016-01-13 (×2): 10 mg via ORAL
  Filled 2016-01-12 (×2): qty 1

## 2016-01-12 MED ORDER — PROPOFOL 10 MG/ML IV BOLUS
INTRAVENOUS | Status: DC | PRN
  Administered 2016-01-12: 180 mg via INTRAVENOUS

## 2016-01-12 MED ORDER — SODIUM CHLORIDE 0.9% FLUSH: 3.0000 mL | Freq: Two times a day (BID) | INTRAVENOUS | Status: DC

## 2016-01-12 MED ORDER — ONDANSETRON HCL 4 MG/2ML IJ SOLN: 4.0000 mg | Freq: Once | INTRAMUSCULAR | Status: DC | PRN

## 2016-01-12 MED ORDER — INSULIN ASPART 100 UNIT/ML ~~LOC~~ SOLN
0.0000 [IU] | Freq: Three times a day (TID) | SUBCUTANEOUS | Status: DC
  Administered 2016-01-13 – 2016-01-14 (×3): 3 [IU] via SUBCUTANEOUS

## 2016-01-12 MED ORDER — PHENYLEPHRINE HCL 10 MG/ML IJ SOLN
INTRAMUSCULAR | Status: DC | PRN
  Administered 2016-01-12: 120 ug via INTRAVENOUS
  Administered 2016-01-12: 80 ug via INTRAVENOUS
  Administered 2016-01-12 (×2): 120 ug via INTRAVENOUS

## 2016-01-12 MED ORDER — ONDANSETRON HCL 4 MG/2ML IJ SOLN
INTRAMUSCULAR | Status: DC | PRN
  Administered 2016-01-12: 4 mg via INTRAVENOUS

## 2016-01-12 MED ORDER — LOSARTAN POTASSIUM 50 MG PO TABS
100.0000 mg | ORAL_TABLET | Freq: Every day | ORAL | Status: DC
  Administered 2016-01-13 – 2016-01-14 (×2): 100 mg via ORAL
  Filled 2016-01-12 (×2): qty 2

## 2016-01-12 MED ORDER — HYDROMORPHONE HCL 1 MG/ML IJ SOLN
0.5000 mg | INTRAMUSCULAR | Status: DC | PRN
  Administered 2016-01-12: 1 mg via INTRAVENOUS
  Filled 2016-01-12: qty 1

## 2016-01-12 MED ORDER — LOSARTAN POTASSIUM 50 MG PO TABS: 100.0000 mg | ORAL_TABLET | Freq: Every day | ORAL | Status: DC

## 2016-01-12 MED ORDER — ROCURONIUM BROMIDE 100 MG/10ML IV SOLN
INTRAVENOUS | Status: DC | PRN
  Administered 2016-01-12: 50 mg via INTRAVENOUS

## 2016-01-12 MED ORDER — LIDOCAINE HCL (CARDIAC) 20 MG/ML IV SOLN
INTRAVENOUS | Status: DC | PRN
  Administered 2016-01-12: 50 mg via INTRATRACHEAL
  Administered 2016-01-12: 50 mg via INTRAVENOUS

## 2016-01-12 MED ORDER — HYDROMORPHONE HCL 1 MG/ML IJ SOLN: 0.2500 mg | INTRAMUSCULAR | Status: DC | PRN

## 2016-01-12 MED ORDER — FENTANYL CITRATE (PF) 100 MCG/2ML IJ SOLN
INTRAMUSCULAR | Status: DC | PRN
  Administered 2016-01-12: 100 ug via INTRAVENOUS
  Administered 2016-01-12 (×3): 50 ug via INTRAVENOUS

## 2016-01-12 MED ORDER — LACTATED RINGERS IV SOLN
INTRAVENOUS | Status: DC
  Administered 2016-01-12 (×2): via INTRAVENOUS

## 2016-01-12 SURGICAL SUPPLY — 59 items
APL SKNCLS STERI-STRIP NONHPOA (GAUZE/BANDAGES/DRESSINGS) ×1
BAG DECANTER FOR FLEXI CONT (MISCELLANEOUS) ×2 IMPLANT
BENZOIN TINCTURE PRP APPL 2/3 (GAUZE/BANDAGES/DRESSINGS) ×2 IMPLANT
BIT DRILL 13 (BIT) ×1 IMPLANT
BRUSH SCRUB EZ PLAIN DRY (MISCELLANEOUS) ×2 IMPLANT
BUR MATCHSTICK NEURO 3.0 LAGG (BURR) ×2 IMPLANT
CANISTER SUCT 3000ML PPV (MISCELLANEOUS) ×2 IMPLANT
DECANTER SPIKE VIAL GLASS SM (MISCELLANEOUS) ×1 IMPLANT
DRAIN CHANNEL 7F 3/4 FLAT (WOUND CARE) ×1 IMPLANT
DRAPE C-ARM 42X72 X-RAY (DRAPES) ×4 IMPLANT
DRAPE LAPAROTOMY 100X72 PEDS (DRAPES) ×2 IMPLANT
DRAPE MICROSCOPE LEICA (MISCELLANEOUS) ×2 IMPLANT
DRAPE POUCH INSTRU U-SHP 10X18 (DRAPES) ×2 IMPLANT
DRSG OPSITE POSTOP 4X6 (GAUZE/BANDAGES/DRESSINGS) ×2 IMPLANT
DURAPREP 6ML APPLICATOR 50/CS (WOUND CARE) ×2 IMPLANT
ELECT COATED BLADE 2.86 ST (ELECTRODE) ×2 IMPLANT
ELECT REM PT RETURN 9FT ADLT (ELECTROSURGICAL) ×2
ELECTRODE REM PT RTRN 9FT ADLT (ELECTROSURGICAL) ×1 IMPLANT
EVACUATOR SILICONE 100CC (DRAIN) ×1 IMPLANT
GAUZE SPONGE 4X4 12PLY STRL (GAUZE/BANDAGES/DRESSINGS) ×2 IMPLANT
GAUZE SPONGE 4X4 16PLY XRAY LF (GAUZE/BANDAGES/DRESSINGS) IMPLANT
GLOVE BIO SURGEON STRL SZ7 (GLOVE) ×2 IMPLANT
GLOVE BIO SURGEON STRL SZ8 (GLOVE) ×4 IMPLANT
GLOVE BIOGEL PI IND STRL 7.0 (GLOVE) IMPLANT
GLOVE BIOGEL PI IND STRL 7.5 (GLOVE) IMPLANT
GLOVE BIOGEL PI INDICATOR 7.0 (GLOVE) ×2
GLOVE BIOGEL PI INDICATOR 7.5 (GLOVE) ×2
GLOVE INDICATOR 8.5 STRL (GLOVE) ×2 IMPLANT
GOWN STRL REUS W/ TWL LRG LVL3 (GOWN DISPOSABLE) IMPLANT
GOWN STRL REUS W/ TWL XL LVL3 (GOWN DISPOSABLE) ×1 IMPLANT
GOWN STRL REUS W/TWL 2XL LVL3 (GOWN DISPOSABLE) IMPLANT
GOWN STRL REUS W/TWL LRG LVL3 (GOWN DISPOSABLE) ×2
GOWN STRL REUS W/TWL XL LVL3 (GOWN DISPOSABLE) ×4
HALTER HD/CHIN CERV TRACTION D (MISCELLANEOUS) ×2 IMPLANT
HEMOSTAT POWDER KIT SURGIFOAM (HEMOSTASIS) ×2 IMPLANT
KIT BASIN OR (CUSTOM PROCEDURE TRAY) ×2 IMPLANT
KIT ROOM TURNOVER OR (KITS) ×2 IMPLANT
LIQUID BAND (GAUZE/BANDAGES/DRESSINGS) ×2 IMPLANT
NDL SPNL 20GX3.5 QUINCKE YW (NEEDLE) ×1 IMPLANT
NEEDLE SPNL 20GX3.5 QUINCKE YW (NEEDLE) ×2 IMPLANT
NS IRRIG 1000ML POUR BTL (IV SOLUTION) ×2 IMPLANT
PACK LAMINECTOMY NEURO (CUSTOM PROCEDURE TRAY) ×2 IMPLANT
PLATE 3 57.5XLCK NS SPNE CVD (Plate) IMPLANT
PLATE 3 ATLANTIS TRANS (Plate) ×2 IMPLANT
RUBBERBAND STERILE (MISCELLANEOUS) ×4 IMPLANT
SCREW ST FIX 4 ATL 3120213 (Screw) ×8 IMPLANT
SPACER CERV FRGE 12X14X6-0 (Spacer) ×1 IMPLANT
SPACER CERVICAL FRGE 12X14X6-7 (Spacer) ×1 IMPLANT
SPACER CERVICAL FRGE 12X14X7-7 (Spacer) ×1 IMPLANT
SPONGE INTESTINAL PEANUT (DISPOSABLE) ×2 IMPLANT
SPONGE SURGIFOAM ABS GEL 100 (HEMOSTASIS) ×2 IMPLANT
STRIP CLOSURE SKIN 1/2X4 (GAUZE/BANDAGES/DRESSINGS) ×2 IMPLANT
SUT VIC AB 3-0 SH 8-18 (SUTURE) ×2 IMPLANT
SUT VICRYL 4-0 PS2 18IN ABS (SUTURE) ×2 IMPLANT
TAPE CLOTH 4X10 WHT NS (GAUZE/BANDAGES/DRESSINGS) IMPLANT
TOWEL OR 17X24 6PK STRL BLUE (TOWEL DISPOSABLE) ×2 IMPLANT
TOWEL OR 17X26 10 PK STRL BLUE (TOWEL DISPOSABLE) ×2 IMPLANT
TRAP SPECIMEN MUCOUS 40CC (MISCELLANEOUS) ×2 IMPLANT
WATER STERILE IRR 1000ML POUR (IV SOLUTION) ×2 IMPLANT

## 2016-01-12 NOTE — Anesthesia Postprocedure Evaluation (Signed)
Anesthesia Post Note  Patient: Jocelyn Jackson  Procedure(s) Performed: Procedure(s) (LRB): CERVICAL THREE-FOUR. CERVICAL FOUR-FIVE, CERVICAL FIVE-SIX ANTERIOR CERVICAL DISCECTOMY/FUSION (N/A)  Patient location during evaluation: PACU Anesthesia Type: General Level of consciousness: awake, awake and alert and oriented Pain management: pain level controlled Vital Signs Assessment: post-procedure vital signs reviewed and stable Respiratory status: spontaneous breathing, respiratory function stable and nonlabored ventilation Anesthetic complications: no    Last Vitals:  Filed Vitals:   01/12/16 1700 01/12/16 1727  BP: 147/70 123/68  Pulse: 99 99  Temp: 36.1 C 36.7 C  Resp: 20 20    Last Pain:  Filed Vitals:   01/12/16 1819  PainSc: 5                  Ghazal Pevey COKER

## 2016-01-12 NOTE — Op Note (Signed)
Preoperative diagnosis: Cervical spondylitic myelopathy from severe cervical stenosis C3-4, C4-5, C5-6.  Postoperative diagnosis: Same  Procedure: Anterior cervical discectomies and fusion at C3-4, C4-5, C5-6 utilizing allograft/translational plating system.  Surgeon: Wayland SalinasGary Graham  Assistant: Marikay Alaravid Jones  Anesthesia: Gen.  EBL: Minimal  History of present illness: Patient was 65 year old female is a progress worsening neck pain pain numbness tingling weakness in her left upper extremity. Workup revealed a large disc herniation at C5-5-6 as well as C3-4 causes cord compression as well as spondylosis C4-5. Due to patient's physical exam consistent with a progressive myelopathy imaging findings showing severe stenosis and cord compression are recommended after cervical discectomies and fusion. I extensively reviewed the risks and benefits of the operation with the patient as well as perioperative course expectations of outcome and alternatives to surgery and she understood and agreed to proceed forward.  Operative procedure: Patient brought in earlier was induced on general anesthesia positioned supine and the neck in slight extension 5 pounds all distraction the right 7  Procedure after routine sterile fashion preoperative x-ray localized the appropriate level so a curvilinear incision was made just off midline to the intraportal cervical or mastoid and the superficial layer of vitreous was dissected out and divided longitudinally the avascular anterior cervical muscle instrument was instructed on percussion. Breast osmolar contrast. Intraoperative x-ray confirmed location C3-4 disc space so annulotomy was made with a 15-wise, markedly displaced self-retaining reduction was placed after dissecting the longus laterally at L3 disc spaces. Then after adequate exposure retraction but achieved all annulotomy as were made anterior R Spencer but noted but not with 3 mm Kerrison punch. All 3 disc spaces were  drilled on the posterior annulus and osteochondral buttocks. Under microscopic combination first C3-4 and PLL was identified and there was very large disc herniation migrated subligamentous assist teased out with a nerve hook aggressive undermining both endplates removed extensive additional disc material from behind the C3 and C4 vertebral bodies. At the meniscectomies over the stenosis centrally was able to palpate both C4 pedicles and double C4 nerve roots decompressed. Then packed this with Gelfoam constricting C5-6 in a similar fashion C5-6 was drilled down the posterior posterior annulus was identified and posterior lung sling was removed piecemeal fashion. Aggressive undermining of both endplates decompress the central canal and marked stenosis was removed at this level also both C6 nerve roots were identified and skeletonized portion of the C6 pedicle. Then this was packed with Gelfoam and C4-5 is decompress in similar fashion Cipro is mostly spondylitic with no frank disc herniation after adequate decompression achieved and the peel-away was removed all 3 doses. The spaces were prepared to receive the allografts 6 mm allograft for insert selected for all levels lordotic at C3-4 and C4-5 parallel 56. All bone grafts went very well proximally one 2 mm deep to the anterior vertebral body line. Then a 50 7/2 a glass plate was placed all screws and excellent purchase locking mechanisms were gated was considered peptic since this was maintained a 7 mm fully fluted flat JP was placed and the wounds closed in layers with interrupted Vicryl and the skin was closed running 4 subcuticular Dermabond benzoin and Steri-Strips were applied and patient recovered in stable condition. At the case all needle, sponge counts were correct.

## 2016-01-12 NOTE — H&P (Signed)
Jocelyn Jackson is an 65 y.o. female.   Chief Complaint: Neck pain left shoulder and arm pain numbness tingling weakness HPI: Patient is a 65 year old female is a progress worsening neck pain and bilateral numbness in her arms worse on the left with weakness in her left upper cavity. Workup revealed severe spinal cord compression from large disc herniations at C3-4 and C5-6 with spondylosis C4-5. Due to her clinical syndrome with progressive worsening in a myelopathy I recommended anterior cervical discectomies and fusion at those 3 levels. I extensively reviewed the risks and benefits of the operation the patient as well as perioperative course expectations of outcome and alternatives of surgery and she understood and agreed to proceed forward.  Past Medical History  Diagnosis Date  . Hypertension   . Diabetes mellitus without complication (HCC)   . Chronic back pain   . Lumbar radiculopathy   . Anxiety   . Arthritis     Past Surgical History  Procedure Laterality Date  . Carpal tunnel release  2012    keeling  . Abdominal hysterectomy      cervix removed  . Colonoscopy N/A 05/06/2015    Procedure: COLONOSCOPY;  Surgeon: Malissa Hippo, MD;  Location: [facility];  Service: Endoscopy;  Laterality: N/A;  830    Family History  Problem Relation Age of Onset  . Heart disease Mother   . Hypertension Mother   . Diabetes Mother   . Heart disease Father   . Hypertension Father   . Diabetes Father   . Cancer Sister   . Hypertension Brother   . Hypertension Sister   . Hypertension Sister    Social History:  reports that she has quit smoking. Her smoking use included Cigarettes. She has a .125 pack-year smoking history. She has never used smokeless tobacco. She reports that she does not drink alcohol or use illicit drugs.  Allergies:  Allergies  Allergen Reactions  . Chlorhexidine Itching    Medications Prior to Admission  Medication Sig Dispense Refill  . amLODipine (NORVASC)  10 MG tablet Take 10 mg by mouth daily.    Marland Kitchen atorvastatin (LIPITOR) 10 MG tablet Take 10 mg by mouth daily.  3  . hydrochlorothiazide (HYDRODIURIL) 25 MG tablet Take 25 mg by mouth daily.    Marland Kitchen ibuprofen (ADVIL,MOTRIN) 800 MG tablet Take 800 mg by mouth 3 (three) times daily as needed. pain    . losartan (COZAAR) 100 MG tablet Take 100 mg by mouth daily.  11  . metFORMIN (GLUCOPHAGE) 500 MG tablet Take 500 mg by mouth daily with breakfast.   11  . naproxen (NAPROSYN) 500 MG tablet Take 1 tablet (500 mg total) by mouth 2 (two) times daily. 20 tablet 0  . potassium chloride SA (KLOR-CON M20) 20 MEQ tablet Take 20 mEq by mouth daily.      Results for orders placed or performed during the hospital encounter of 01/12/16 (from the past 48 hour(s))  Glucose, capillary     Status: Abnormal   Collection Time: 01/12/16 11:14 AM  Result Value Ref Range   Glucose-Capillary 101 (H) 65 - 99 mg/dL   No results found.  Review of Systems  Constitutional: Negative.   Eyes: Negative.   Respiratory: Negative.   Cardiovascular: Negative.   Gastrointestinal: Negative.   Genitourinary: Negative.   Musculoskeletal: Positive for myalgias and neck pain.  Skin: Negative.   Neurological: Positive for tingling and focal weakness.    Blood pressure 163/67, pulse 90, temperature  98.9 F (37.2 C), temperature source Oral, resp. rate 20, weight 65.046 kg (143 lb 6.4 oz), SpO2 100 %. Physical Exam  Constitutional: She is oriented to person, place, and time. She appears well-developed and well-nourished.  HENT:  Head: Normocephalic.  Eyes: Pupils are equal, round, and reactive to light.  Neck: Normal range of motion.  Respiratory: Effort normal.  GI: Soft. Bowel sounds are normal.  Neurological: She is alert and oriented to person, place, and time. She has normal strength. GCS eye subscore is 4. GCS verbal subscore is 5. GCS motor subscore is 6.  Patient is awake alert she has 4 out of 5 weakness of her left  tricep and hand intrinsic otherwise she is 5 out of 5 deltoid biceps triceps wrist flexion sent to hand intrinsics.  Skin: Skin is warm and dry.     Assessment/Plan 65 year old female with cervical myelopathy presents for ACDF at C3-4, C4-5, C5-6.  Izabelle Daus P, MD 01/12/2016, 12:33 PM

## 2016-01-12 NOTE — Transfer of Care (Signed)
Immediate Anesthesia Transfer of Care Note  Patient: Jocelyn Jackson  Procedure(s) Performed: Procedure(s): CERVICAL THREE-FOUR. CERVICAL FOUR-FIVE, CERVICAL FIVE-SIX ANTERIOR CERVICAL DISCECTOMY/FUSION (N/A)  Patient Location: PACU  Anesthesia Type:General  Level of Consciousness: awake, alert , oriented and patient cooperative  Airway & Oxygen Therapy: Patient Spontanous Breathing and Patient connected to nasal cannula oxygen  Post-op Assessment: Report given to RN, Post -op Vital signs reviewed and stable, Patient moving all extremities and Patient moving all extremities X 4  Post vital signs: Reviewed and stable  Last Vitals:  Filed Vitals:   01/12/16 1106  BP: 163/67  Pulse: 90  Temp: 37.2 C  Resp: 20    Last Pain: There were no vitals filed for this visit.       Complications: No apparent anesthesia complications

## 2016-01-12 NOTE — Anesthesia Procedure Notes (Signed)
Procedure Name: Intubation Date/Time: 01/12/2016 1:17 PM Performed by: Adonis HousekeeperNGELL, Samson Ralph M Pre-anesthesia Checklist: Patient identified, Emergency Drugs available, Suction available and Patient being monitored Patient Re-evaluated:Patient Re-evaluated prior to inductionOxygen Delivery Method: Circle system utilized Preoxygenation: Pre-oxygenation with 100% oxygen Intubation Type: IV induction Ventilation: Mask ventilation without difficulty and Oral airway inserted - appropriate to patient size Laryngoscope Size: Glidescope and 3 Grade View: Grade I Tube type: Oral Tube size: 7.0 mm Number of attempts: 1 Airway Equipment and Method: Stylet Placement Confirmation: ETT inserted through vocal cords under direct vision,  positive ETCO2 and breath sounds checked- equal and bilateral Secured at: 21 cm Tube secured with: Tape Dental Injury: Teeth and Oropharynx as per pre-operative assessment

## 2016-01-12 NOTE — Anesthesia Preprocedure Evaluation (Addendum)
Anesthesia Evaluation  Patient identified by MRN, date of birth, ID band Patient awake    Reviewed: Allergy & Precautions, NPO status , Patient's Chart, lab work & pertinent test results  Airway Mallampati: II  TM Distance: >3 FB Neck ROM: Limited    Dental  (+) Teeth Intact, Dental Advisory Given   Pulmonary former smoker,    breath sounds clear to auscultation       Cardiovascular hypertension,  Rhythm:Regular Rate:Normal     Neuro/Psych    GI/Hepatic   Endo/Other  diabetes  Renal/GU      Musculoskeletal   Abdominal   Peds  Hematology   Anesthesia Other Findings   Reproductive/Obstetrics                            Anesthesia Physical Anesthesia Plan  ASA: III  Anesthesia Plan: General   Post-op Pain Management:    Induction: Intravenous  Airway Management Planned: Oral ETT  Additional Equipment:   Intra-op Plan:   Post-operative Plan: Extubation in OR  Informed Consent: I have reviewed the patients History and Physical, chart, labs and discussed the procedure including the risks, benefits and alternatives for the proposed anesthesia with the patient or authorized representative who has indicated his/her understanding and acceptance.   Dental advisory given  Plan Discussed with: CRNA and Anesthesiologist  Anesthesia Plan Comments: (Cervical spondylosis with myelopathy Type 2 DM glucose 101 Htn  Plan GA with oral ETT  Jocelyn Jackson )        Anesthesia Quick Evaluation

## 2016-01-12 NOTE — Progress Notes (Signed)
Orthopedic Tech Progress Note Patient Details:  Jocelyn Jackson Dec 16, 1950 [id number] Patient was a pre-fit. Patient ID: Jocelyn Jackson, female   DOB: Dec 16, 1950, 65 y.o.   MRN: [id number]   [name], [name] 01/12/2016, 10:53 PM

## 2016-01-13 ENCOUNTER — Encounter (HOSPITAL_COMMUNITY): Payer: Self-pay | Admitting: Neurosurgery

## 2016-01-13 LAB — GLUCOSE, CAPILLARY
GLUCOSE-CAPILLARY: 159 mg/dL — AB (ref 65–99)
Glucose-Capillary: 153 mg/dL — ABNORMAL HIGH (ref 65–99)
Glucose-Capillary: 184 mg/dL — ABNORMAL HIGH (ref 65–99)

## 2016-01-13 MED ORDER — CYCLOBENZAPRINE HCL 10 MG PO TABS: 10.0000 mg | ORAL_TABLET | Freq: Three times a day (TID) | ORAL | Status: DC | PRN

## 2016-01-13 MED ORDER — OXYCODONE-ACETAMINOPHEN 5-325 MG PO TABS: 1.0000 | ORAL_TABLET | ORAL | Status: DC | PRN

## 2016-01-13 NOTE — Progress Notes (Signed)
Patient ID: Jocelyn Jackson, female   DOB: 18-Feb-1951, 65 y.o.   MRN: [id number] Arm significantly improved still with neck pain soreness and swelling difficulty  Strength out of 5 wound clean dry and intact  Mobilized more today possible discharge later today

## 2016-01-13 NOTE — Discharge Instructions (Signed)

## 2016-01-13 NOTE — Discharge Summary (Signed)
  Physician Discharge Summary  Patient ID: Jocelyn Jackson MRN: [id number] DOB/AGE: [birth year] 65 y.o.  Admit date: 01/12/2016 Discharge date: 01/13/2016  Admission Diagnoses:Cervical spondylosis with myelopathy  Discharge Diagnoses: Same Active Problems:   Myelopathy, spondylogenic, cervical   Discharged Condition: good  Hospital Course: Patient is a hospital underwent anterior cervical discectomies and fusion from C3-C6 postoperatively patient did very well sitting up improvement preoperative radicular symptoms and numbness in arm incision clean dry and intact we'll continue to work on increasing her functional status today possible discharge his abdomen  Consults: Significant Diagnostic Studies: Treatments: ACDF C3-4, C4-5, C5-6 Discharge Exam: Blood pressure 125/60, pulse 87, temperature 98 F (36.7 C), temperature source Oral, resp. rate 16, weight 65.046 kg (143 lb 6.4 oz), SpO2 100 %. Strength out of 5 wound clean dry and intact  Disposition: Home     Medication List    STOP taking these medications        hydrochlorothiazide 25 MG tablet  Commonly known as:  HYDRODIURIL      TAKE these medications        amLODipine 10 MG tablet  Commonly known as:  NORVASC  Take 10 mg by mouth daily.     atorvastatin 10 MG tablet  Commonly known as:  LIPITOR  Take 10 mg by mouth daily.     cyclobenzaprine 10 MG tablet  Commonly known as:  FLEXERIL  Take 1 tablet (10 mg total) by mouth 3 (three) times daily as needed for muscle spasms.     ibuprofen 800 MG tablet  Commonly known as:  ADVIL,MOTRIN  Take 800 mg by mouth 3 (three) times daily as needed. pain     KLOR-CON M20 20 MEQ tablet  Generic drug:  potassium chloride SA  Take 20 mEq by mouth daily.     losartan 100 MG tablet  Commonly known as:  COZAAR  Take 100 mg by mouth daily.     metFORMIN 500 MG tablet  Commonly known as:  GLUCOPHAGE  Take 500 mg by mouth daily with breakfast.     naproxen 500 MG tablet   Commonly known as:  NAPROSYN  Take 1 tablet (500 mg total) by mouth 2 (two) times daily.     oxyCODONE-acetaminophen 5-325 MG tablet  Commonly known as:  PERCOCET/ROXICET  Take 1-2 tablets by mouth every 4 (four) hours as needed for moderate pain.         Signed: Briele Lagasse P 01/13/2016, 7:33 AM

## 2016-01-13 NOTE — Evaluation (Signed)
Physical Therapy Evaluation and Discharge Patient Details Name: Jocelyn Jackson H Kasparian MRN: [id number] DOB: September 01, 1950 Today's Date: 01/13/2016   History of Present Illness  65 y.o. female s/p C3-4, C4-5, C5-6 ACDF. PMH significant for HTN, DM, chronic back pain, lumbar radiculopathy, anxiety, and arthritis.  Clinical Impression  Patient evaluated by Physical Therapy with no further acute PT needs identified. All education has been completed and the patient has no further questions. At the time of PT eval pt was able to perform transfers and ambulation with modified independence. Husband present at end of session and both he and pt were educated on general safety at home, precautions, and car transfer. See below for any follow-up Physical Therapy or equipment needs. PT is signing off. Thank you for this referral.     Follow Up Recommendations Outpatient PT;Supervision for mobility/OOB    Equipment Recommendations  None recommended by PT    Recommendations for Other Services       Precautions / Restrictions Precautions Precautions: Cervical;Fall Precaution Booklet Issued: Yes (comment) Precaution Comments: Reviewed cervical precautions and provided handout Required Braces or Orthoses: Cervical Brace Cervical Brace: Soft collar;At all times Restrictions Weight Bearing Restrictions: No      Mobility  Bed Mobility               General bed mobility comments: Pt received standing up in room  Transfers Overall transfer level: Needs assistance Equipment used: None Transfers: Sit to/from Stand Sit to Stand: Modified independent (Device/Increase time)         General transfer comment: No physical assist required. Good safety awareness.   Ambulation/Gait Ambulation/Gait assistance: Modified independent (Device/Increase time) Ambulation Distance (Feet): 200 Feet Assistive device: None Gait Pattern/deviations: Step-through pattern;Decreased stride length Gait velocity:  Decreased Gait velocity interpretation: Below normal speed for age/gender General Gait Details: Pt was able to ambulate in hall with no assistance. Overall demonstrating good balance and no unsteadiness noted.   Stairs            Wheelchair Mobility    Modified Rankin (Stroke Patients Only)       Balance Overall balance assessment: Needs assistance Sitting-balance support: Feet supported;No upper extremity supported Sitting balance-Leahy Scale: Good     Standing balance support: No upper extremity supported Standing balance-Leahy Scale: Good                               Pertinent Vitals/Pain Pain Assessment: Faces Faces Pain Scale: Hurts a little bit Pain Location: Neck Pain Descriptors / Indicators: Operative site guarding Pain Intervention(s): Limited activity within patient's tolerance;Monitored during session;Repositioned    Home Living Family/patient expects to be discharged to:: Private residence Living Arrangements: Spouse/significant other Available Help at Discharge: Family;Available PRN/intermittently Type of Home: House Home Access: Level entry     Home Layout: One level Home Equipment: Hand held shower head Additional Comments: Husband and daughter work during the day. Pt will be home alone most days.    Prior Function Level of Independence: Independent         Comments: Works as a Programme researcher, broadcasting/film/videoCNA     Hand Dominance   Dominant Hand: Right    Extremity/Trunk Assessment   Upper Extremity Assessment: Overall WFL for tasks assessed           Lower Extremity Assessment: Overall WFL for tasks assessed      Cervical / Trunk Assessment: Normal  Communication   Communication: No difficulties  Cognition Arousal/Alertness:  Awake/alert Behavior During Therapy: WFL for tasks assessed/performed Overall Cognitive Status: Within Functional Limits for tasks assessed                      General Comments      Exercises         Assessment/Plan    PT Assessment Patent does not need any further PT services  PT Diagnosis Difficulty walking;Acute pain   PT Problem List    PT Treatment Interventions     PT Goals (Current goals can be found in the Care Plan section) Acute Rehab PT Goals Patient Stated Goal: to go home PT Goal Formulation: All assessment and education complete, DC therapy    Frequency     Barriers to discharge        Co-evaluation               End of Session Equipment Utilized During Treatment: Cervical collar Activity Tolerance: Patient tolerated treatment well Patient left: in chair;with call bell/phone within reach;with family/visitor present Nurse Communication: Mobility status         Time: 1610-9604 PT Time Calculation (min) (ACUTE ONLY): 10 min   Charges:   PT Evaluation $PT Eval Moderate Complexity: 1 Procedure     PT G Codes:        Conni Slipper Feb 04, 2016, 12:09 PM  Conni Slipper, PT, DPT Acute Rehabilitation Services Pager: [phone]

## 2016-01-13 NOTE — Progress Notes (Signed)
Occupational Therapy Evaluation/Discharge Patient Details Name: Jocelyn Jackson MRN: [id number] DOB: 28-Mar-1951 Today's Date: 01/13/2016    History of Present Illness 65 y.o. female s/p C3-4, C4-5, C5-6 ACDF. PMH significant for HTN, DM, chronic back pain, lumbar radiculopathy, anxiety, and arthritis.   Clinical Impression   PTA, pt was independent with ADLs and mobility. Pt currently requires supervision for all ADLs and transfers. Educated pt on cervical precautions, cervical brace wear protocol, and compensatory strategies for LB ADLs. All education has been completed and pt has no further questions. Pt with no further acute OT needs. OT signing off.    Follow Up Recommendations  No OT follow up;Supervision - Intermittent    Equipment Recommendations  3 in 1 bedside comode    Recommendations for Other Services       Precautions / Restrictions Precautions Precautions: Cervical;Fall Precaution Booklet Issued: Yes (comment) Precaution Comments: Reviewed cervical precautions and provided handout Required Braces or Orthoses: Cervical Brace Cervical Brace: Soft collar;At all times Restrictions Weight Bearing Restrictions: No      Mobility Bed Mobility Overal bed mobility: Needs Assistance Bed Mobility: Rolling;Sidelying to Sit;Sit to Sidelying Rolling: Supervision Sidelying to sit: Supervision     Sit to sidelying: Supervision General bed mobility comments: Supervision for safety. No physical assist required.  Transfers Overall transfer level: Needs assistance Equipment used: None Transfers: Sit to/from Stand Sit to Stand: Supervision         General transfer comment: Supervision for safety. No physical assist required.    Balance Overall balance assessment: Needs assistance Sitting-balance support: No upper extremity supported;Feet supported Sitting balance-Leahy Scale: Good     Standing balance support: No upper extremity supported;During functional  activity Standing balance-Leahy Scale: Good                              ADL Overall ADL's : Needs assistance/impaired     Grooming: Wash/dry hands;Wash/dry face;Supervision/safety;Standing   Upper Body Bathing: Set up;Sitting   Lower Body Bathing: Set up;Sit to/from stand;Cueing for compensatory techniques Lower Body Bathing Details (indicate cue type and reason): able to cross ankle-over-knee Upper Body Dressing : Set up;Sitting   Lower Body Dressing: Set up;Sit to/from stand;Cueing for compensatory techniques Lower Body Dressing Details (indicate cue type and reason): able to cross ankle-over-knee Toilet Transfer: Supervision/safety;Ambulation;BSC   Toileting- Architect and Hygiene: Supervision/safety;Sit to/from stand   Tub/ Shower Transfer: Tub transfer;Supervision/safety;Ambulation;Cueing for safety Tub/Shower Transfer Details (indicate cue type and reason): cues for safe hand placement Functional mobility during ADLs: Supervision/safety General ADL Comments: Educated on cervical precautions, brace wear protocol, and compensatory strategies for LB ADLs. No family present for OT eval.     Vision Vision Assessment?: No apparent visual deficits   Perception     Praxis      Pertinent Vitals/Pain Pain Assessment: 0-10 Pain Score: 4  Pain Location: neck Pain Descriptors / Indicators: Sore Pain Intervention(s): Limited activity within patient's tolerance;Monitored during session;Premedicated before session;Repositioned     Hand Dominance Right   Extremity/Trunk Assessment Upper Extremity Assessment Upper Extremity Assessment: Overall WFL for tasks assessed   Lower Extremity Assessment Lower Extremity Assessment: Overall WFL for tasks assessed   Cervical / Trunk Assessment Cervical / Trunk Assessment: Normal   Communication Communication Communication: No difficulties   Cognition Arousal/Alertness: Awake/alert Behavior During Therapy:  WFL for tasks assessed/performed Overall Cognitive Status: Within Functional Limits for tasks assessed  General Comments       Exercises       Shoulder Instructions      Home Living Family/patient expects to be discharged to:: Private residence Living Arrangements: Spouse/significant other Available Help at Discharge: Family;Available PRN/intermittently Type of Home: House Home Access: Level entry     Home Layout: One level     Bathroom Shower/Tub: Tub/shower unit Shower/tub characteristics: Engineer, building servicesCurtain Bathroom Toilet: Standard     Home Equipment: Hand held shower head   Additional Comments: Husband and daughter work during the day. Pt will be home alone most days.      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    OT Diagnosis: Acute pain   OT Problem List: Decreased activity tolerance;Impaired balance (sitting and/or standing);Decreased safety awareness;Decreased knowledge of use of DME or AE;Pain   OT Treatment/Interventions:      OT Goals(Current goals can be found in the care plan section) Acute Rehab OT Goals Patient Stated Goal: to go home OT Goal Formulation: With patient Time For Goal Achievement: 01/27/16 Potential to Achieve Goals: Good  OT Frequency:     Barriers to D/C:            Co-evaluation              End of Session Equipment Utilized During Treatment: Gait belt;Cervical collar Nurse Communication: Mobility status  Activity Tolerance: Patient tolerated treatment well Patient left: in chair;with call bell/phone within reach   Time: 0816-0829 OT Time Calculation (min): 13 min Charges:  OT General Charges $OT Visit: 1 Procedure OT Evaluation $OT Eval Low Complexity: 1 Procedure G-Codes:    Nils PyleJulia Ajane Novella, OTR/L Pager: [phone]: [phone] 01/13/2016, 8:54 AM

## 2016-01-14 LAB — GLUCOSE, CAPILLARY: Glucose-Capillary: 178 mg/dL — ABNORMAL HIGH (ref 65–99)

## 2016-01-14 NOTE — Care Management Important Message (Signed)
Important Message  Patient Details  Name: Jocelyn Jackson MRN: [id number] Date of Birth: [birth year]   Medicare Important Message Given:  Yes    Ekansh Sherk Abena 01/14/2016, 10:58 AM

## 2016-01-14 NOTE — Progress Notes (Signed)
Patient alert and oriented, mae's well, voiding adequate amount of urine, swallowing without difficulty, no c/o pain. Patient discharged home with family. Script and discharged instructions given to patient. Patient and family stated understanding of d/c instructions given and has an appointment with MD. 

## 2016-02-10 DIAGNOSIS — M5023 Other cervical disc displacement, cervicothoracic region: Secondary | ICD-10-CM | POA: Diagnosis not present

## 2016-02-10 DIAGNOSIS — M542 Cervicalgia: Secondary | ICD-10-CM | POA: Diagnosis not present

## 2016-03-14 DIAGNOSIS — I10 Essential (primary) hypertension: Secondary | ICD-10-CM | POA: Diagnosis not present

## 2016-03-14 DIAGNOSIS — M542 Cervicalgia: Secondary | ICD-10-CM | POA: Diagnosis not present

## 2016-03-14 DIAGNOSIS — M5023 Other cervical disc displacement, cervicothoracic region: Secondary | ICD-10-CM | POA: Diagnosis not present

## 2016-03-27 ENCOUNTER — Encounter (HOSPITAL_COMMUNITY): Payer: Self-pay

## 2016-03-27 ENCOUNTER — Ambulatory Visit (HOSPITAL_COMMUNITY): Payer: Medicare Other | Admitting: Physical Therapy

## 2016-03-30 ENCOUNTER — Ambulatory Visit (HOSPITAL_COMMUNITY): Payer: Medicare Other | Attending: Neurosurgery | Admitting: Physical Therapy

## 2016-03-30 DIAGNOSIS — M542 Cervicalgia: Secondary | ICD-10-CM | POA: Diagnosis not present

## 2016-03-30 DIAGNOSIS — R293 Abnormal posture: Secondary | ICD-10-CM

## 2016-03-30 DIAGNOSIS — R29898 Other symptoms and signs involving the musculoskeletal system: Secondary | ICD-10-CM

## 2016-03-30 DIAGNOSIS — R252 Cramp and spasm: Secondary | ICD-10-CM | POA: Diagnosis not present

## 2016-03-30 NOTE — Therapy (Signed)
[facility] [address] [place], [place], [address] Phone: [phone]   Fax:  [phone]  Physical Therapy Evaluation  Patient Details  Name: Jocelyn Jackson MRN: [id number] Date of Birth: 1950/12/25 Referring Provider: Donalee Citrin   Encounter Date: 03/30/2016      PT End of Session - 03/30/16 1217    Visit Number 1   Number of Visits 16   Date for PT Re-Evaluation 05/04/16   Authorization Type BCBS Medicare HMO    Authorization Time Period 03/30/16 to 05/30/16   Authorization - Visit Number 1   Authorization - Number of Visits 10   PT Start Time 1130  PT running late from previous patient    PT Stop Time 1208   PT Time Calculation (min) 38 min   Activity Tolerance Patient tolerated treatment well   Behavior During Therapy [facility] for tasks assessed/performed      Past Medical History:  Diagnosis Date  . Anxiety   . Arthritis   . Chronic back pain   . Diabetes mellitus without complication (HCC)   . Hypertension   . Lumbar radiculopathy     Past Surgical History:  Procedure Laterality Date  . ABDOMINAL HYSTERECTOMY     cervix removed  . ANTERIOR CERVICAL DECOMP/DISCECTOMY FUSION N/A 01/12/2016   Procedure: CERVICAL THREE-FOUR. CERVICAL FOUR-FIVE, CERVICAL FIVE-SIX ANTERIOR CERVICAL DISCECTOMY/FUSION;  Surgeon: Donalee Citrin, MD;  Location: [facility]URO [facility]S;  Service: Neurosurgery;  Laterality: N/A;  . CARPAL TUNNEL RELEASE  2012   [place]  . COLONOSCOPY N/A 05/06/2015   Procedure: COLONOSCOPY;  Surgeon: Malissa Hippo, MD;  Location: [facility] ENDO SUITE;  Service: Endoscopy;  Laterality: N/A;  830    There were no vitals filed for this visit.       Subjective Assessment - 03/30/16 1131    Subjective Patient reports that she started having pain in her shoulder and neck for quite awhile to the point where she could not sleep; she ended up being referred to a spine specialist in [place], who ended up doing a cervical fusion surgery on  01/12/16. Patietn states that her whole head is heavy, like something is weighing on her shoulders, just a lot pressure and pain. Not really any numbness or tingling but she does report alterred sensation in her L UE. She cannot lift anything real heavy because it starts to make her arm hurt.    Pertinent History anxiety, DM, history of LBP, HTN, anterior cervical fusion done on 01/12/16   How long can you sit comfortably? unlimited    How long can you stand comfortably? limited by feelings of heaviness in her neck after about 30 minutes    How long can you walk comfortably? limited by feelings of heaviness in her neck after about 30 minutes    Patient Stated Goals get rid of pain, get neck stronger    Currently in Pain? Yes   Pain Score 8    Pain Location Other (Comment)  head and neck    Pain Orientation Posterior   Pain Descriptors / Indicators Aching;Nagging   Pain Type Chronic pain   Pain Radiating Towards none    Pain Onset More than a month ago   Pain Frequency Constant   Aggravating Factors  being up on her feet too much, looking upwards    Pain Relieving Factors changing positions    Effect of Pain on Daily Activities none  [facility] PT Assessment - 03/30/16 0001      Assessment   Medical Diagnosis cervicalgia    Referring Provider Donalee CitrinGary Cram    Onset Date/Surgical Date 01/12/16   Next MD Visit Dr. Wynetta Emeryram September 7th      Balance Screen   Has the patient fallen in the past 6 months No   Has the patient had a decrease in activity level because of a fear of falling?  Yes   Is the patient reluctant to leave their home because of a fear of falling?  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    Leisure working on [employer], gardening      Sensation   Light  Touch Impaired by gross assessment;Impaired Detail   Light Touch Impaired Details Impaired LUE  impaired C5/C6/C7     Posture/Postural Control   Posture/Postural Control Postural limitations   Postural Limitations Rounded Shoulders;Forward head;Increased lumbar lordosis;Increased thoracic kyphosis  reduced cervical lordosis      AROM   Right Shoulder Flexion 135 Degrees   Right Shoulder ABduction 143 Degrees   Right Shoulder Internal Rotation --  approximately T11-T12    Right Shoulder External Rotation --  fingertips to approx level of C7 but approx 6-8" from neck    Left Shoulder Flexion 120 Degrees   Left Shoulder ABduction 115 Degrees   Left Shoulder Internal Rotation --  approximately T11-T12    Left Shoulder External Rotation --  fingertips to approx level of C7 but approx 6-8" from neck    Cervical Flexion 25   Cervical Extension 13   Cervical - Right Side Bend 8  multiple compensations    Cervical - Left Side Bend 8  multiple compensations    Cervical - Right Rotation 18   Cervical - Left Rotation 20     Strength   Right Shoulder Flexion 4+/5   Right Shoulder ABduction 4+/5   Right Shoulder Internal Rotation 4+/5   Right Shoulder External Rotation 4+/5   Left Shoulder Flexion 4/5   Left Shoulder ABduction 4/5   Left Shoulder Internal Rotation 4+/5   Left Shoulder External Rotation 4+/5                           PT Education - 03/30/16 1217    Education provided Yes   Education Details prognosis, POC, HEP    Person(s) Educated Patient   Methods Explanation;Demonstration;Handout   Comprehension Verbalized understanding;Returned demonstration;Need further instruction;Verbal cues required;Tactile cues required          PT Short Term Goals - 03/30/16 1225      PT SHORT TERM GOAL #1   Title Patient to show improvement of at least 25 degrees in all tested cervical motions in order to reduce pain and improve general QOL    Time 4   Period  Weeks   Status New     PT SHORT TERM GOAL #2   Title Patient to demonstrate bilateral shoulder ROM as being WFL in order to reduce pain and improve ability to perform ADLs/self-care    Time 4   Period Weeks   Status New     PT SHORT TERM GOAL #3   Title Patient to maintain correct posture at least 75%  of the time in order to reduce discomfort and improve functional task performance skills    Time 4   Period Weeks   Status New     PT SHORT TERM GOAL #4   Title Patient to correctly and consistently perform appropriate HEP, to be updated PRN    Time 4   Period Weeks   Status New           PT Long Term Goals - 03/30/16 1229      PT LONG TERM GOAL #1   Title Patient to demonstrate cervical ROM as being within 15 degrees of normal on all measured planes in order to reduce pain and assist in improving general function   Time 8   Period Weeks   Status New     PT LONG TERM GOAL #2   Title Patient to experience pain no more than 2/10 during all functional tasks in order to improve QOL and faciltate return to work    Time 8   Period Weeks   Status New     PT LONG TERM GOAL #3   Title Patient to demonstrate at least a 70% reduction in muscle knotting and spasm in order to assist in reducing pain and posture    Time 8   Period Weeks   Status New     PT LONG TERM GOAL #4   Title Patient to demonstrate ability to perform all functional work related tasks with correct form and biomechanics in order 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 Upon examination, patient reveals  significant postural deviations, severe stiffness of cervical spine with thoracic and lumbar compensations noted upon ROM testing, bilateral shoulder stiffness, functional muscle weakness, and reduced functional task performance skills. Patient required mod verbal  and tactile cues to achieve correct form with HEP today, and will require consistent and aggressive focus on her exercise form to facilitate success with HEP and PT in general. At this time recommend skilled PT services in order to address functional limitations and reach optimal level of function with minimal pain.    Rehab Potential Good   PT Frequency 2x / week   PT Duration 8 weeks   PT Treatment/Interventions ADLs/Self Care Home Management;Biofeedback;Cryotherapy;Moist Heat;Functional mobility training;Therapeutic activities;Therapeutic exercise;Balance training;Neuromuscular re-education;Patient/family education;Manual techniques;Passive range of motion;Energy conservation;Taping   PT Next Visit Plan review HEP and goals; focus on cervical, thoracic, and shoudler mobility, posture. Progress to strength and functional task training as ROM improves. Manual PRN.    PT Home Exercise Plan 8/24- 3D cervical and thoracic excursions    Consulted and Agree with Plan of Care Patient      Patient will benefit from skilled therapeutic intervention in order to improve the following deficits and impairments:  Improper body mechanics, Pain, Increased muscle spasms, Postural dysfunction, Decreased activity tolerance, Decreased range of motion, Decreased strength, Hypomobility, Impaired UE functional use, Impaired flexibility  Visit Diagnosis: Cervicalgia - Plan: PT plan of care cert/re-cert  Abnormal posture - Plan: PT plan of care cert/re-cert  Cramp and spasm - Plan: PT plan of care cert/re-cert  Other symptoms and signs involving the musculoskeletal system - Plan: PT plan of care cert/re-cert      G-Codes - 2016-04-24 01/08/31    Functional  Assessment Tool Used Based on skilled clinical assessment of ROM, posture, strength, pain    Functional Limitation Mobility: Walking and moving around   Mobility: Walking and Moving Around Current Status ([id number]) At least 60 percent but less than 80 percent impaired, limited or restricted   Mobility: Walking and Moving Around Goal Status [phone]) At least 40 percent but less than 60 percent impaired, limited or restricted       Problem List Patient Active Problem List   Diagnosis Date Noted  . Myelopathy, spondylogenic, cervical 01/12/2016  . Annual physical exam 09/13/2015    Nedra Hai PT, DPT [phone]  [facility] [address] [place], [place], [address] Phone: [phone]   Fax:  [phone]  Name: Jocelyn Jackson MRN: [id number] Date of Birth: 1951/05/24

## 2016-03-31 ENCOUNTER — Ambulatory Visit (HOSPITAL_COMMUNITY): Payer: Medicare Other | Admitting: Physical Therapy

## 2016-03-31 DIAGNOSIS — M542 Cervicalgia: Secondary | ICD-10-CM | POA: Diagnosis not present

## 2016-03-31 DIAGNOSIS — R252 Cramp and spasm: Secondary | ICD-10-CM

## 2016-03-31 DIAGNOSIS — R29898 Other symptoms and signs involving the musculoskeletal system: Secondary | ICD-10-CM

## 2016-03-31 DIAGNOSIS — R293 Abnormal posture: Secondary | ICD-10-CM

## 2016-03-31 NOTE — Patient Instructions (Signed)
Scapular Retraction (Standing)    With arms at sides, pinch shoulder blades together. Repeat _5___ times per set. Do 1____ sets per session. Do _3___ sessions per day.  http://orth.exer.us/944   Copyright  VHI. All rights reserved.  Flexibility: Neck Retraction    Pull head straight back, keeping eyes and jaw level. Repeat __5__ times per set. Do __1__ sets per session. Do __3__ sessions per day.  http://orth.exer.us/344   Copyright  VHI. All rights reserved.  Strengthening: Shoulder Shrug (Phase 1)    Shrug shoulders up and down, and backward. Repeat _5___ times per set. Do _1___ sets per session. Do __2__ sessions per day.  http://orth.exer.us/336   Copyright  VHI. All rights reserved.

## 2016-03-31 NOTE — Therapy (Signed)
[place] [facility] [address] [place], [place], [address] Phone: [phone]   Fax:  [phone]  Physical Therapy Treatment  Patient Details  Name: Jocelyn Jackson MRN: [id number] Date of Birth: 1951-06-12 Referring Provider: Donalee Citrin   Encounter Date: 03/31/2016      PT End of Session - 03/31/16 1435    Visit Number 2   Number of Visits 16   Date for PT Re-Evaluation 05/04/16   Authorization Type BCBS Medicare HMO    Authorization Time Period 03/30/16 to 05/30/16   Authorization - Visit Number 2   Authorization - Number of Visits 10   PT Start Time 1345   PT Stop Time 1430   PT Time Calculation (min) 45 min   Activity Tolerance Patient tolerated treatment well   Behavior During Therapy [facility] for tasks assessed/performed      Past Medical History:  Diagnosis Date  . Anxiety   . Arthritis   . Chronic back pain   . Diabetes mellitus without complication (HCC)   . Hypertension   . Lumbar radiculopathy     Past Surgical History:  Procedure Laterality Date  . ABDOMINAL HYSTERECTOMY     cervix removed  . ANTERIOR CERVICAL DECOMP/DISCECTOMY FUSION N/A 01/12/2016   Procedure: CERVICAL THREE-FOUR. CERVICAL FOUR-FIVE, CERVICAL FIVE-SIX ANTERIOR CERVICAL DISCECTOMY/FUSION;  Surgeon: Donalee Citrin, MD;  [facility];  Service: Neurosurgery;  Laterality: N/A;  . CARPAL TUNNEL RELEASE  2012   [place]  . COLONOSCOPY N/A 05/06/2015   Procedure: COLONOSCOPY;  Surgeon: Malissa Hippo, MD;  [facility];  Service: Endoscopy;  Laterality: N/A;  830    There were no vitals filed for this visit.      Subjective Assessment - 03/31/16 1348    Subjective Pt states she has been doing the exercises but she is still very stiff.   She does not think that she will be returning to work.    Pertinent History anxiety, DM, history of LBP, HTN, anterior cervical fusion done on 01/12/16   How long can you sit comfortably? unlimited    How long  can you stand comfortably? limited by feelings of heaviness in her neck after about 30 minutes    How long can you walk comfortably? limited by feelings of heaviness in her neck after about 30 minutes    Patient Stated Goals get rid of pain, get neck stronger    Pain Score 8    Pain Location Neck   Pain Orientation Lower   Pain Descriptors / Indicators Aching   Pain Type Chronic pain   Pain Radiating Towards no radiating    Pain Onset More than a month ago   Pain Frequency Constant   Aggravating Factors  lifting/ carrying/HP   Pain Relieving Factors medication                          [facility] Adult PT Treatment/Exercise - 03/31/16 0001      Posture/Postural Control   Posture/Postural Control Postural limitations   Postural Limitations Rounded Shoulders;Forward head;Increased lumbar lordosis;Increased thoracic kyphosis  reduced cervical lordosis      Exercises   Exercises Neck     Neck Exercises: Seated   Neck Retraction 5 reps   Shoulder Shrugs 5 reps   Shoulder Rolls Backwards;5 reps   Other Seated Exercise 3-D cervical excursion;    Other Seated Exercise scapular retraction      Manual Therapy  Manual Therapy Soft tissue mobilization   Manual therapy comments done seperate from all other aspects of treatment   Soft tissue mobilization marked mm spasm in bilateral trapezius musculature with deep pressure and kneading techniques used to decrease spasms.  Spasms decreased but no obliterated.      Neck Exercises: Stretches   Chest Stretch 5 reps                PT Education - 03/31/16 1433    Education provided Yes   Education Details updated HEP   Person(s) Educated Patient   Methods Explanation;Demonstration;Tactile cues;Verbal cues   Comprehension Verbalized understanding;Returned demonstration;Need further instruction          PT Short Term Goals - 03/31/16 1442      PT SHORT TERM GOAL #1   Title Patient to show improvement of at least  25 degrees in all tested cervical motions in order to reduce pain and improve general QOL    Time 4   Period Weeks   Status On-going     PT SHORT TERM GOAL #2   Title Patient to demonstrate bilateral shoulder ROM as being WFL in order to reduce pain and improve ability to perform ADLs/self-care    Time 4   Period Weeks   Status On-going     PT SHORT TERM GOAL #3   Title Patient to maintain correct posture at least 75% of the time in order to reduce discomfort and improve functional task performance skills    Time 4   Period Weeks   Status On-going     PT SHORT TERM GOAL #4   Title Patient to correctly and consistently perform appropriate HEP, to be updated PRN    Time 4   Period Weeks   Status On-going           PT Long Term Goals - 03/31/16 1443      PT LONG TERM GOAL #1   Title Patient to demonstrate cervical ROM as being within 15 degrees of normal on all measured planes in order to reduce pain and assist in improving general function   Time 8   Period Weeks   Status On-going     PT LONG TERM GOAL #2   Title Patient to experience pain no more than 2/10 during all functional tasks in order to improve QOL and faciltate return to work    Time 8   Period Weeks   Status On-going     PT LONG TERM GOAL #3   Title Patient to demonstrate at least a 70% reduction in muscle knotting and spasm in order to assist in reducing pain and posture    Time 8   Period Weeks   Status On-going     PT LONG TERM GOAL #4   Title Patient to demonstrate ability to perform all functional work related tasks with correct form and biomechanics 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  Pt has significant difficulty in motor coordination.  Pt will need review  of all exercises to ensure proper technique.  Pt has marked  palpatable muscle spasms in bilteral trapezius musculature which was lessened with manual but not obliterated.     Rehab Potential Good  PT Frequency 2x / week   PT Duration 8 weeks   PT Treatment/Interventions ADLs/Self Care Home Management;Biofeedback;Cryotherapy;Moist Heat;Functional mobility training;Therapeutic activities;Therapeutic exercise;Balance training;Neuromuscular re-education;Patient/family education;Manual techniques;Passive range of motion;Energy conservation;Taping   PT Next Visit Plan review HEP and goals; focus on cervical, thoracic, and shoudler mobility, posture. Progress to strength and functional task training as ROM improves. Manual PRN.    PT Home Exercise Plan 04-13-2023- 3D cervical and thoracic excursions , 8/25-scapular and cervical retraction, shoulder shrugs    Consulted and Agree with Plan of Care Patient      Patient will benefit from skilled therapeutic intervention in order to improve the following deficits and impairments:  Improper body mechanics, Pain, Increased muscle spasms, Postural dysfunction, Decreased activity tolerance, Decreased range of motion, Decreased strength, Hypomobility, Impaired UE functional use, Impaired flexibility  Visit Diagnosis: Cervicalgia  Abnormal posture  Cramp and spasm  Other symptoms and signs involving the musculoskeletal system       G-Codes - April 12, 2016 1232    Functional Assessment Tool Used Based on skilled clinical assessment of ROM, posture, strength, pain    Functional Limitation Mobility: Walking and moving around   Mobility: Walking and Moving Around Current Status [phone]) At least 60 percent but less than 80 percent impaired, limited or restricted   Mobility: Walking and Moving Around Goal Status [phone]) At least 40 percent but less than 60 percent impaired, limited or restricted      Problem List Patient Active Problem List   Diagnosis Date Noted  . Myelopathy, spondylogenic, cervical 01/12/2016  . Annual  physical exam 09/13/2015    Virgina Organ, PT CLT [phone] 03/31/2016, 2:44 PM  [place] [facility] [address] [place], [place], [address] Phone: [phone]   Fax:  [phone]  Name: Jocelyn Jackson MRN: [id number] Date of Birth: 09/14/1950

## 2016-04-03 DIAGNOSIS — D239 Other benign neoplasm of skin, unspecified: Secondary | ICD-10-CM | POA: Diagnosis not present

## 2016-04-03 DIAGNOSIS — L219 Seborrheic dermatitis, unspecified: Secondary | ICD-10-CM | POA: Diagnosis not present

## 2016-04-04 ENCOUNTER — Ambulatory Visit (HOSPITAL_COMMUNITY): Payer: Medicare Other | Admitting: Physical Therapy

## 2016-04-04 DIAGNOSIS — M542 Cervicalgia: Secondary | ICD-10-CM | POA: Diagnosis not present

## 2016-04-04 DIAGNOSIS — R293 Abnormal posture: Secondary | ICD-10-CM

## 2016-04-04 DIAGNOSIS — R29898 Other symptoms and signs involving the musculoskeletal system: Secondary | ICD-10-CM

## 2016-04-04 DIAGNOSIS — R252 Cramp and spasm: Secondary | ICD-10-CM

## 2016-04-04 NOTE — Therapy (Signed)
[place] [facility] [address] [place], [place], [address] Phone: [phone]   Fax:  [phone]  Physical Therapy Treatment  Patient Details  Name: REEVE MALLO MRN: [id number] Date of Birth: 1951-06-15 Referring Provider: Donalee Citrin   Encounter Date: 04/04/2016      PT End of Session - 04/04/16 1123    Visit Number 3   Number of Visits 16   Date for PT Re-Evaluation 05/04/16   Authorization Type BCBS Medicare HMO    Authorization Time Period 03/30/16 to 05/30/16   Authorization - Visit Number 3   Authorization - Number of Visits 10   Activity Tolerance Patient tolerated treatment well   Behavior During Therapy [facility] for tasks assessed/performed      Past Medical History:  Diagnosis Date  . Anxiety   . Arthritis   . Chronic back pain   . Diabetes mellitus without complication (HCC)   . Hypertension   . Lumbar radiculopathy     Past Surgical History:  Procedure Laterality Date  . ABDOMINAL HYSTERECTOMY     cervix removed  . ANTERIOR CERVICAL DECOMP/DISCECTOMY FUSION N/A 01/12/2016   Procedure: CERVICAL THREE-FOUR. CERVICAL FOUR-FIVE, CERVICAL FIVE-SIX ANTERIOR CERVICAL DISCECTOMY/FUSION;  Surgeon: Donalee Citrin, MD;  Location: [facility];  Service: Neurosurgery;  Laterality: N/A;  . CARPAL TUNNEL RELEASE  2012   [place]  . COLONOSCOPY N/A 05/06/2015   Procedure: COLONOSCOPY;  Surgeon: Malissa Hippo, MD;  [facility];  Service: Endoscopy;  Laterality: N/A;  830    There were no vitals filed for this visit.      Subjective Assessment - 04/04/16 0954    Subjective Pt states her pain is a 8/10 today in her bilateral Upper traps.  States she has not gotten any relief yet and only has felt better for up to 30 minutes after leaving therapy.   Currently in Pain? Yes   Pain Score 8    Pain Location Neck   Pain Orientation Lower   Pain Descriptors / Indicators Aching   Pain Type Chronic pain   Pain Frequency Constant                          [facility] Adult PT Treatment/Exercise - 04/04/16 0956      Neck Exercises: Machines for Strengthening   UBE (Upper Arm Bike) 4 minutes level 1 backward     Neck Exercises: Seated   Neck Retraction 5 reps   Other Seated Exercise 3-D cervical excursion, thoracic excursions with manual cues 8 reps      Manual Therapy   Manual Therapy Soft tissue mobilization   Manual therapy comments done seperate from all other aspects of treatment, seated   Soft tissue mobilization marked mm spasm in bilateral trapezius musculature with deep pressure and kneading techniques used to decrease spasms.  Spasms decreased but no obliterated.                   PT Short Term Goals - 03/31/16 1442      PT SHORT TERM GOAL #1   Title Patient to show improvement of at least 25 degrees in all tested cervical motions in order to reduce pain and improve general QOL    Time 4   Period Weeks   Status On-going     PT SHORT TERM GOAL #2   Title Patient to demonstrate bilateral shoulder ROM as being [facility] in order to reduce pain  and improve ability to perform ADLs/self-care    Time 4   Period Weeks   Status On-going     PT SHORT TERM GOAL #3   Title Patient to maintain correct posture at least 75% of the time in order to reduce discomfort and improve functional task performance skills    Time 4   Period Weeks   Status On-going     PT SHORT TERM GOAL #4   Title Patient to correctly and consistently perform appropriate HEP, to be updated PRN    Time 4   Period Weeks   Status On-going           PT Long Term Goals - 03/31/16 1443      PT LONG TERM GOAL #1   Title Patient to demonstrate cervical ROM as being within 15 degrees of normal on all measured planes in order to reduce pain and assist in improving general function   Time 8   Period Weeks   Status On-going     PT LONG TERM GOAL #2   Title Patient to experience pain no more than 2/10 during all  functional tasks in order to improve QOL and faciltate return to work    Time 8   Period Weeks   Status On-going     PT LONG TERM GOAL #3   Title Patient to demonstrate at least a 70% reduction in muscle knotting and spasm in order to assist in reducing pain and posture    Time 8   Period Weeks   Status On-going     PT LONG TERM GOAL #4   Title Patient to demonstrate ability to perform all functional work related tasks with correct form and 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  Pt requires tactile cues with most all exercises to complete correctly.  bilateral upper traps with general tightness and multiple spasms.  Able to reduce all pain to 0/10 after manual techniqes with patient only reporting tenderness/soreness but no pain.   Rehab Potential Good   PT Frequency 2x / week   PT Duration 8 weeks   PT Treatment/Interventions ADLs/Self Care Home Management;Biofeedback;Cryotherapy;Moist Heat;Functional mobility training;Therapeutic activities;Therapeutic exercise;Balance training;Neuromuscular re-education;Patient/family education;Manual techniques;Passive range of motion;Energy conservation;Taping   PT Next Visit Plan continue focus on cervical, thoracic, and shoudler mobility, posture. Progress to strength and functional task training as ROM improves. Manual PRN.    PT Home Exercise Plan 8/24- 3D cervical and thoracic excursions , 8/25-scapular and cervical retraction, shoulder shrugs    Consulted and Agree with Plan of Care Patient      Patient will benefit from skilled therapeutic intervention in order to improve the following deficits and impairments:  Improper body mechanics, Pain, Increased muscle spasms, Postural dysfunction, Decreased activity tolerance,  Decreased range of motion, Decreased strength, Hypomobility, Impaired UE functional use, Impaired flexibility  Visit Diagnosis: Cervicalgia  Abnormal posture  Cramp and spasm  Other symptoms and signs involving the musculoskeletal system     Problem List Patient Active Problem List   Diagnosis Date Noted  . Myelopathy, spondylogenic, cervical 01/12/2016  . Annual physical exam 09/13/2015    Lurena Nida, PTA/CLT [phone]  04/04/2016, 11:32 AM  [place]  [facility] [address] , [place], [address] Phone: [phone]   Fax:  [phone]  Name: Johny Shearsnnie H Revolorio MRN: [id number] Date of Birth: [birth year]

## 2016-04-06 ENCOUNTER — Ambulatory Visit (HOSPITAL_COMMUNITY): Payer: Medicare Other | Admitting: Physical Therapy

## 2016-04-06 DIAGNOSIS — R252 Cramp and spasm: Secondary | ICD-10-CM

## 2016-04-06 DIAGNOSIS — R29898 Other symptoms and signs involving the musculoskeletal system: Secondary | ICD-10-CM

## 2016-04-06 DIAGNOSIS — R293 Abnormal posture: Secondary | ICD-10-CM

## 2016-04-06 DIAGNOSIS — M542 Cervicalgia: Secondary | ICD-10-CM | POA: Diagnosis not present

## 2016-04-06 NOTE — Therapy (Signed)
[place] [facility] [address] [place], [place], [address] Phone: [phone]   Fax:  [phone]  Physical Therapy Treatment  Patient Details  Name: Jocelyn Jackson MRN: [id number] Date of Birth: 1951-05-02 Referring Provider: Donalee CitrinGary Cram   Encounter Date: 04/06/2016      PT End of Session - 04/06/16 0904    Visit Number 4   Number of Visits 16   Date for PT Re-Evaluation 05/04/16   Authorization Type BCBS Medicare HMO    Authorization Time Period 03/30/16 to 05/30/16   Authorization - Visit Number 4   Authorization - Number of Visits 10   PT Start Time 0815   PT Stop Time 0847  8 minutes of moist heat, unbilled    PT Time Calculation (min) 32 min   Activity Tolerance Patient tolerated treatment well   [facility] for tasks assessed/performed      Past Medical History:  Diagnosis Date  . Anxiety   . Arthritis   . Chronic back pain   . Diabetes mellitus without complication (HCC)   . Hypertension   . Lumbar radiculopathy     Past Surgical History:  Procedure Laterality Date  . ABDOMINAL HYSTERECTOMY     cervix removed  . ANTERIOR CERVICAL DECOMP/DISCECTOMY FUSION N/A 01/12/2016   Procedure: CERVICAL THREE-FOUR. CERVICAL FOUR-FIVE, CERVICAL FIVE-SIX ANTERIOR CERVICAL DISCECTOMY/FUSION;  Surgeon: Donalee CitrinGary Cram, MD;  [facility];  Service: Neurosurgery;  Laterality: N/A;  . CARPAL TUNNEL RELEASE  2012   [place]  . COLONOSCOPY N/A 05/06/2015   Procedure: COLONOSCOPY;  Surgeon: Malissa HippoNajeeb U Rehman, MD;  [facility];  Service: Endoscopy;  Laterality: N/A;  830    There were no vitals filed for this visit.      Subjective Assessment - 04/06/16 0817    Subjective Patient arrives today stating she is doing OK, she is still having pain in her neck/upper traps but she is "keeping on doing"   Currently in Pain? Yes   Pain Score 7    Pain Location Neck   Pain Orientation Lower                          [facility] Adult PT Treatment/Exercise - 04/06/16 0001      Neck Exercises: Standing   Other Standing Exercises scapular retractions in wall pushup posture with heavy tactile cues      Neck Exercises: Seated   Other Seated Exercise cervical/thoracic 3D excursions 1x10, cues and tactile cues for form    Other Seated Exercise backwards shoulder rolls 1x20 with heavy tactile cues      Modalities   Modalities Moist Heat     Moist Heat Therapy   Number Minutes Moist Heat 8 Minutes   Moist Heat Location Cervical;Other (comment)  neck, B upper traps in supine      Manual Therapy   Manual Therapy Soft tissue mobilization   Manual therapy comments done seperate from all other aspects of treatment, seated   Soft tissue mobilization focus on L upper trap/cervical extensors in supine post-moist heat                 PT Education - 04/06/16 0903    Education provided Yes   Education Details heavy tactile cues and verbal explanation of correct form/posture, neuromuscular re-ed process    Person(s) Educated Patient   Methods Explanation;Tactile cues;Verbal cues;Demonstration   Comprehension Verbalized understanding  PT Short Term Goals - 03/31/16 1442      PT SHORT TERM GOAL #1   Title Patient to show improvement of at least 25 degrees in all tested cervical motions in order to reduce pain and improve general QOL    Time 4   Period Weeks   Status On-going     PT SHORT TERM GOAL #2   Title Patient to demonstrate bilateral shoulder ROM as being WFL in order to reduce pain and improve ability to perform ADLs/self-care    Time 4   Period Weeks   Status On-going     PT SHORT TERM GOAL #3   Title Patient to maintain correct posture at least 75% of the time in order to reduce discomfort and improve functional task performance skills    Time 4   Period Weeks   Status On-going     PT SHORT TERM GOAL #4   Title Patient to correctly and  consistently perform appropriate HEP, to be updated PRN    Time 4   Period Weeks   Status On-going           PT Long Term Goals - 03/31/16 1443      PT LONG TERM GOAL #1   Title Patient to demonstrate cervical ROM as being within 15 degrees of normal on all measured planes in order to reduce pain and assist in improving general function   Time 8   Period Weeks   Status On-going     PT LONG TERM GOAL #2   Title Patient to experience pain no more than 2/10 during all functional tasks in order to improve QOL and faciltate return to work    Time 8   Period Weeks   Status On-going     PT LONG TERM GOAL #3   Title Patient to demonstrate at least a 70% reduction in muscle knotting and spasm in order to assist in reducing pain and posture    Time 8   Period Weeks   Status On-going     PT LONG TERM GOAL #4   Title Patient to demonstrate ability to perform all functional work related tasks with correct form and 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 Followed this with seated 3D cervical and thoracic excursions and general postural exercises this session. Patient requires tactile and verbal cues for form throughout session for form, especially for cervical and thoracic excursions today although form does appear to be improving over time. Patient requires heavy tactile cues for correct form throughout session, tends to dominate with upper traps and compensations throughout session.    Rehab Potential Good   PT Frequency 2x / week   PT Duration 8 weeks   PT Treatment/Interventions ADLs/Self Care Home  Management;Biofeedback;Cryotherapy;Moist Heat;Functional mobility training;Therapeutic activities;Therapeutic exercise;Balance training;Neuromuscular re-education;Patient/family education;Manual techniques;Passive range of motion;Energy conservation;Taping   PT Next Visit Plan TAKE SHOULDER SHRUGS OFF HEP DUE TO UPPER TRAP DOMINANCE. continue with moist heat followed immediately by manual; patient requires heavy tactile cues for correct form with  exercise, stay hands on. Scapular retractions in wall-pushup position.    PT Home Exercise Plan 8/24- 3D cervical and thoracic excursions , 8/25-scapular and cervical retraction, shoulder shrugs    Consulted and Agree with Plan of Care Patient      Patient will benefit from skilled therapeutic intervention in order to improve the following deficits and impairments:  Improper body mechanics, Pain, Increased muscle spasms, Postural dysfunction, Decreased activity tolerance, Decreased range of motion, Decreased strength, Hypomobility, Impaired UE functional use, Impaired flexibility  Visit Diagnosis: Cervicalgia  Abnormal posture  Cramp and spasm  Other symptoms and signs involving the musculoskeletal system     Problem List Patient Active Problem List   Diagnosis Date Noted  . Myelopathy, spondylogenic, cervical 01/12/2016  . Annual physical exam 09/13/2015    Nedra Hai PT, DPT [phone]  [facility] [address] [place], [place], [address] Phone: [phone]   Fax:  [phone]  Name: Jocelyn Jackson MRN: [id number] Date of Birth: 02/23/1951

## 2016-04-11 ENCOUNTER — Ambulatory Visit (HOSPITAL_COMMUNITY): Payer: Medicare Other | Attending: Neurosurgery | Admitting: Physical Therapy

## 2016-04-11 DIAGNOSIS — M542 Cervicalgia: Secondary | ICD-10-CM | POA: Diagnosis not present

## 2016-04-11 DIAGNOSIS — R252 Cramp and spasm: Secondary | ICD-10-CM

## 2016-04-11 DIAGNOSIS — R293 Abnormal posture: Secondary | ICD-10-CM

## 2016-04-11 DIAGNOSIS — R29898 Other symptoms and signs involving the musculoskeletal system: Secondary | ICD-10-CM | POA: Diagnosis not present

## 2016-04-11 NOTE — Therapy (Signed)
[place] [facility] [address] [place], [place], [address] Phone: [phone]   Fax:  [phone]  Physical Therapy Treatment  Patient Details  Name: Jocelyn Jackson MRN: [id number] Date of Birth: 1951/08/05 Referring Provider: Donalee CitrinGary Cram   Encounter Date: 04/11/2016      PT End of Session - 04/11/16 0858    Visit Number 5   Number of Visits 16   Date for PT Re-Evaluation 05/04/16   Authorization Type BCBS Medicare HMO    Authorization Time Period 03/30/16 to 05/30/16   Authorization - Visit Number 5   Authorization - Number of Visits 10   PT Start Time [phone]  patient in restroom/started session with moist heat    PT Stop Time 0857   PT Time Calculation (min) 29 min   Activity Tolerance Patient tolerated treatment well   Behavior During Therapy [facility] for tasks assessed/performed      Past Medical History:  Diagnosis Date  . Anxiety   . Arthritis   . Chronic back pain   . Diabetes mellitus without complication (HCC)   . Hypertension   . Lumbar radiculopathy     Past Surgical History:  Procedure Laterality Date  . ABDOMINAL HYSTERECTOMY     cervix removed  . ANTERIOR CERVICAL DECOMP/DISCECTOMY FUSION N/A 01/12/2016   Procedure: CERVICAL THREE-FOUR. CERVICAL FOUR-FIVE, CERVICAL FIVE-SIX ANTERIOR CERVICAL DISCECTOMY/FUSION;  Surgeon: Donalee CitrinGary Cram, MD;  [facility];  Service: Neurosurgery;  Laterality: N/A;  . CARPAL TUNNEL RELEASE  2012   [place]  . COLONOSCOPY N/A 05/06/2015   Procedure: COLONOSCOPY;  Surgeon: Malissa HippoNajeeb U Rehman, MD;  [facility];  Service: Endoscopy;  Laterality: N/A;  830    There were no vitals filed for this visit.      Subjective Assessment - 04/11/16 0821    Subjective Patient arrives today stating she is doing OK, pain in her neck/traps remains a 7/10. She states she is still doing her HEP.    Pertinent History anxiety, DM, history of LBP, HTN, anterior cervical fusion done on 01/12/16    Currently in Pain? Yes   Pain Score 7    Pain Location Neck   Pain Orientation Left   Pain Descriptors / Indicators Aching   Pain Type Chronic pain   Pain Radiating Towards none    Pain Onset More than a month ago   Pain Frequency Intermittent   Aggravating Factors  cold weather, bending over a lot, reaching up    Pain Relieving Factors sitting down and relaxing    Effect of Pain on Daily Activities pain during ADLs/activities                          [facility] Adult PT Treatment/Exercise - 04/11/16 0001      Neck Exercises: Standing   Other Standing Exercises scapular retractions  in wall pushup 1x10  heavy manual cues      Neck Exercises: Seated   Other Seated Exercise cervical/thoracic 3D excursions 1x10, cues and tactile cues for form    Other Seated Exercise backwards shoulder rolls 3x10, heavy manual cues      Neck Exercises: Supine   Neck Retraction 10 reps   Neck Retraction Limitations heavy manual cues      Moist Heat Therapy   Number Minutes Moist Heat 8 Minutes   Moist Heat Location Cervical;Other (comment)     Manual Therapy   Manual Therapy Soft tissue mobilization;Joint mobilization  Manual therapy comments done seperate from all other aspects of treatment, seated   Joint Mobilization grade 2 PAs approx T10-T3    Soft tissue mobilization L upper traps and cervical extensors                 PT Education - 04/11/16 0858    Education provided Yes   Education Details heavy tactile cues throughout session, form/posture corrections    Person(s) Educated Patient   Methods Explanation;Tactile cues;Verbal cues;Demonstration   Comprehension Verbalized understanding          PT Short Term Goals - 03/31/16 1442      PT SHORT TERM GOAL #1   Title Patient to show improvement of at least 25 degrees in all tested cervical motions in order to reduce pain and improve general QOL    Time 4   Period Weeks   Status On-going     PT SHORT TERM  GOAL #2   Title Patient to demonstrate bilateral shoulder ROM as being WFL in order to reduce pain and improve ability to perform ADLs/self-care    Time 4   Period Weeks   Status On-going     PT SHORT TERM GOAL #3   Title Patient to maintain correct posture at least 75% of the time in order to reduce discomfort and improve functional task performance skills    Time 4   Period Weeks   Status On-going     PT SHORT TERM GOAL #4   Title Patient to correctly and consistently perform appropriate HEP, to be updated PRN    Time 4   Period Weeks   Status On-going           PT Long Term Goals - 03/31/16 1443      PT LONG TERM GOAL #1   Title Patient to demonstrate cervical ROM as being within 15 degrees of normal on all measured planes in order to reduce pain and assist in improving general function   Time 8   Period Weeks   Status On-going     PT LONG TERM GOAL #2   Title Patient to experience pain no more than 2/10 during all functional tasks in order to improve QOL and faciltate return to work    Time 8   Period Weeks   Status On-going     PT LONG TERM GOAL #3   Title Patient to demonstrate at least a 70% reduction in muscle knotting and spasm in order to assist in reducing pain and posture    Time 8   Period Weeks   Status On-going     PT LONG TERM GOAL #4   Title Patient to demonstrate ability to perform all functional work related tasks with correct form and biomechanics 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 Checked patient form with additions to HEP given on 8/25 and decided to eliminate this due to poor form/ongoing dominance of upper trap this session, with HEP focus to remain on mobility. Otherwise  continued to use heavy tactile cues for correct form for functional postural exercises this session.    Rehab Potential Good  PT Frequency 2x / week   PT Duration 8 weeks   PT Treatment/Interventions ADLs/Self Care Home Management;Biofeedback;Cryotherapy;Moist Heat;Functional mobility training;Therapeutic activities;Therapeutic exercise;Balance training;Neuromuscular re-education;Patient/family education;Manual techniques;Passive range of motion;Energy conservation;Taping   PT Next Visit Plan start with moist heat followed by manual, continue to use heavy manual cues for form. Do not add to HEP yet.     PT Home Exercise Plan 9/5- 3D cervical and thoracic excursions; REMOVED scapular and cervical retraction, shoulder shrugs DUE TO POOR FORM/INABILITY TO PERFORM CORRECTLY    Consulted and Agree with Plan of Care Patient      Patient will benefit from skilled therapeutic intervention in order to improve the following deficits and impairments:  Improper body mechanics, Pain, Increased muscle spasms, Postural dysfunction, Decreased activity tolerance, Decreased range of motion, Decreased strength, Hypomobility, Impaired UE functional use, Impaired flexibility  Visit Diagnosis: Cervicalgia  Abnormal posture  Cramp and spasm  Other symptoms and signs involving the musculoskeletal system     Problem List Patient Active Problem List   Diagnosis Date Noted  . Myelopathy, spondylogenic, cervical 01/12/2016  . Annual physical exam 09/13/2015    Nedra Hai PT, DPT [phone]  [facility] [address] [place], [place], [address] Phone: [phone]   Fax:  [phone]  Name: Jocelyn Jackson MRN: [id number] Date of Birth: [birth year]

## 2016-04-13 ENCOUNTER — Ambulatory Visit (HOSPITAL_COMMUNITY): Payer: Medicare Other

## 2016-04-13 DIAGNOSIS — M542 Cervicalgia: Secondary | ICD-10-CM

## 2016-04-13 DIAGNOSIS — R252 Cramp and spasm: Secondary | ICD-10-CM

## 2016-04-13 DIAGNOSIS — R29898 Other symptoms and signs involving the musculoskeletal system: Secondary | ICD-10-CM

## 2016-04-13 DIAGNOSIS — R293 Abnormal posture: Secondary | ICD-10-CM

## 2016-04-13 NOTE — Therapy (Signed)
[place] [facility] [address] [place], [place], [address] Phone: [phone]   Fax:  [phone]  Physical Therapy Treatment  Patient Details  Name: Jocelyn Jackson MRN: [id number] Date of Birth: January 19, 1951 Referring Provider: Donalee Citrin   Encounter Date: 04/13/2016      PT End of Session - 04/13/16 0822    Visit Number 6   Number of Visits 16   Date for PT Re-Evaluation 05/04/16   Authorization Type BCBS Medicare HMO    Authorization Time Period 03/30/16 to 05/30/16   Authorization - Visit Number 6   Authorization - Number of Visits 10   PT Start Time 0817   PT Stop Time 0858   PT Time Calculation (min) 41 min   Activity Tolerance Patient tolerated treatment well   Behavior During Therapy [facility] for tasks assessed/performed      Past Medical History:  Diagnosis Date  . Anxiety   . Arthritis   . Chronic back pain   . Diabetes mellitus without complication (HCC)   . Hypertension   . Lumbar radiculopathy     Past Surgical History:  Procedure Laterality Date  . ABDOMINAL HYSTERECTOMY     cervix removed  . ANTERIOR CERVICAL DECOMP/DISCECTOMY FUSION N/A 01/12/2016   Procedure: CERVICAL THREE-FOUR. CERVICAL FOUR-FIVE, CERVICAL FIVE-SIX ANTERIOR CERVICAL DISCECTOMY/FUSION;  Surgeon: Donalee Citrin, MD;  [facility]: [facility];  Service: Neurosurgery;  Laterality: N/A;  . CARPAL TUNNEL RELEASE  2012   [place]  . COLONOSCOPY N/A 05/06/2015   Procedure: COLONOSCOPY;  Surgeon: Malissa Hippo, MD;  [facility];  Service: Endoscopy;  Laterality: N/A;  830    There were no vitals filed for this visit.      Subjective Assessment - 04/13/16 0816    Subjective Pt stated neck is doing okay today, reports pain scale 7/10 in posterior neck/traps really sore today.   Pertinent History anxiety, DM, history of LBP, HTN, anterior cervical fusion done on 01/12/16   Patient Stated Goals get rid of pain, get neck stronger    Currently in Pain? Yes   Pain Score 7    Pain Location Neck   Pain Orientation Posterior   Pain Descriptors / Indicators Sore   Pain Type Chronic pain   Pain Onset More than a month ago   Pain Frequency Intermittent   Aggravating Factors  cold weather, bending over a lot, reaching up   Pain Relieving Factors sitting down and relaxing   Effect of Pain on Daily Activities pain during ADLs/activities                         [facility] Adult PT Treatment/Exercise - 04/13/16 0001      Neck Exercises: Seated   Other Seated Exercise cervical/thoracic 3D excursions 1x10, cues and tactile cues for form      Neck Exercises: Supine   Neck Retraction 10 reps   Neck Retraction Limitations heavy manual cues      Modalities   Modalities Moist Heat     Moist Heat Therapy   Number Minutes Moist Heat 8 Minutes   Moist Heat Location Cervical;Other (comment)  Bil UT     Manual Therapy   Manual Therapy Soft tissue mobilization;Joint mobilization   Manual therapy comments done seperate from all other aspects of treatment, supine   Soft tissue mobilization L upper traps and cervical extensors  PT Short Term Goals - 03/31/16 1442      PT SHORT TERM GOAL #1   Title Patient to show improvement of at least 25 degrees in all tested cervical motions in order to reduce pain and improve general QOL    Time 4   Period Weeks   Status On-going     PT SHORT TERM GOAL #2   Title Patient to demonstrate bilateral shoulder ROM as being WFL in order to reduce pain and improve ability to perform ADLs/self-care    Time 4   Period Weeks   Status On-going     PT SHORT TERM GOAL #3   Title Patient to maintain correct posture at least 75% of the time in order to reduce discomfort and improve functional task performance skills    Time 4   Period Weeks   Status On-going     PT SHORT TERM GOAL #4   Title Patient to correctly and consistently perform appropriate HEP, to be updated PRN    Time 4    Period Weeks   Status On-going           PT Long Term Goals - 03/31/16 1443      PT LONG TERM GOAL #1   Title Patient to demonstrate cervical ROM as being within 15 degrees of normal on all measured planes in order to reduce pain and assist in improving general function   Time 8   Period Weeks   Status On-going     PT LONG TERM GOAL #2   Title Patient to experience pain no more than 2/10 during all functional tasks in order to improve QOL and faciltate return to work    Time 8   Period Weeks   Status On-going     PT LONG TERM GOAL #3   Title Patient to demonstrate at least a 70% reduction in muscle knotting and spasm in order to assist in reducing pain and posture    Time 8   Period Weeks   Status On-going     PT LONG TERM GOAL #4   Title Patient to demonstrate ability to perform all functional work related tasks with correct form and 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  Session focus on improving cervical ROM and awareness of movement.  Pt required moderate multimodal verbal and tactile cueing to improve form with therex as tendency to use thoracic movements instead of cervical movements.  No reports of increased pain through session.   Rehab Potential Good   PT Frequency 2x / week   PT Duration 8 weeks   PT Treatment/Interventions ADLs/Self Care Home Management;Biofeedback;Cryotherapy;Moist Heat;Functional mobility training;Therapeutic activities;Therapeutic exercise;Balance training;Neuromuscular re-education;Patient/family education;Manual techniques;Passive range of motion;Energy conservation;Taping   PT Next Visit Plan start with moist heat followed by manual,  continue to use heavy manual cues for form. Do not add to HEP yet.     PT Home Exercise Plan 9/5- 3D cervical and thoracic excursions; REMOVED scapular and cervical retraction, shoulder shrugs DUE TO POOR FORM/INABILITY TO PERFORM CORRECTLY       Patient will benefit from skilled therapeutic intervention in  order to improve the following deficits and impairments:  Improper body mechanics, Pain, Increased muscle spasms, Postural dysfunction, Decreased activity tolerance, Decreased range of motion, Decreased strength, Hypomobility, Impaired UE functional use, Impaired flexibility  Visit Diagnosis: Cervicalgia  Abnormal posture  Cramp and spasm  Other symptoms and signs involving the musculoskeletal system     Problem List Patient Active Problem List   Diagnosis Date Noted  . Myelopathy, spondylogenic, cervical 01/12/2016  . Annual physical exam 09/13/2015   Becky Saxasey Cockerham, LPTA; CBIS [phone]  Juel BurrowCockerham, Casey Jo 04/13/2016, 12:49 PM   [facility] [address] [place], [place], [address] Phone: [phone]   Fax:  [phone]  Name: Johny Shearsnnie H Aarons MRN: [id number] Date of Birth: 07/17/1951

## 2016-04-18 ENCOUNTER — Encounter (HOSPITAL_COMMUNITY): Payer: Medicare Other

## 2016-04-19 ENCOUNTER — Ambulatory Visit (HOSPITAL_COMMUNITY): Payer: Medicare Other

## 2016-04-19 DIAGNOSIS — M542 Cervicalgia: Secondary | ICD-10-CM

## 2016-04-19 DIAGNOSIS — R252 Cramp and spasm: Secondary | ICD-10-CM

## 2016-04-19 DIAGNOSIS — R29898 Other symptoms and signs involving the musculoskeletal system: Secondary | ICD-10-CM

## 2016-04-19 DIAGNOSIS — R293 Abnormal posture: Secondary | ICD-10-CM

## 2016-04-19 NOTE — Therapy (Signed)
[place] [facility] [address] [place], [place], [address] Phone: [phone]   Fax:  [phone]  Physical Therapy Treatment  Patient Details  Name: Jocelyn Jackson MRN: [id number] Date of Birth: [birth year] Referring Provider: Donalee Citrin   Encounter Date: 04/19/2016      PT End of Session - 04/19/16 0850    Visit Number 7   Number of Visits 16   Date for PT Re-Evaluation 05/04/16   Authorization Type BCBS Medicare HMO    Authorization Time Period 03/30/16 to 05/30/16   Authorization - Visit Number 7   Authorization - Number of Visits 10   PT Start Time 0845   PT Stop Time 0935   PT Time Calculation (min) 50 min   Activity Tolerance Patient tolerated treatment well   Behavior During Therapy [facility] for tasks assessed/performed      Past Medical History:  Diagnosis Date  . Anxiety   . Arthritis   . Chronic back pain   . Diabetes mellitus without complication (HCC)   . Hypertension   . Lumbar radiculopathy     Past Surgical History:  Procedure Laterality Date  . ABDOMINAL HYSTERECTOMY     cervix removed  . ANTERIOR CERVICAL DECOMP/DISCECTOMY FUSION N/A 01/12/2016   Procedure: CERVICAL THREE-FOUR. CERVICAL FOUR-FIVE, CERVICAL FIVE-SIX ANTERIOR CERVICAL DISCECTOMY/FUSION;  Surgeon: Donalee Citrin, MD;  [facility];  Service: Neurosurgery;  Laterality: N/A;  . CARPAL TUNNEL RELEASE  2012   [place]  . COLONOSCOPY N/A 05/06/2015   Procedure: COLONOSCOPY;  Surgeon: Malissa Hippo, MD;  [facility];  Service: Endoscopy;  Laterality: N/A;  830    There were no vitals filed for this visit.      Subjective Assessment - 04/19/16 0848    Subjective Pt stated her neck feels rough today, pain scale 7/10 posterior neck nagging feeling today, feels weather plays a part.   Pertinent History anxiety, DM, history of LBP, HTN, anterior cervical fusion done on 01/12/16   Patient Stated Goals get rid of pain, get neck stronger     Currently in Pain? Yes   Pain Score 7    Pain Location Neck   Pain Orientation Posterior   Pain Descriptors / Indicators Nagging   Pain Type Chronic pain   Pain Radiating Towards none   Pain Onset More than a month ago   Pain Frequency Intermittent   Aggravating Factors  cold weather, bending over a lot reaching up   Pain Relieving Factors sitting down and relaxing   Effect of Pain on Daily Activities pain during ADLs/activities                         [facility] Adult PT Treatment/Exercise - 04/19/16 0001      Neck Exercises: Standing   Other Standing Exercises scapular retractions  in wall pushup 1x10  heavy cueing including demonstration, verbal and tactile cue     Neck Exercises: Seated   Other Seated Exercise cervical/thoracic 3D excursions 1x10, cues and tactile cues for form    Other Seated Exercise backwards shoulder rolls 3x10, heavy manual cues      Neck Exercises: Supine   Neck Retraction 10 reps   Neck Retraction Limitations heavy manual cues    Other Supine Exercise scapular retraction 10x with verbal and visual cueing     Modalities   Modalities Moist Heat     Moist Heat Therapy   Number Minutes Moist Heat  8 Minutes   Moist Heat Location Cervical;Other (comment)  Bil UT during therex     Manual Therapy   Manual Therapy (P)  Soft tissue mobilization;Passive ROM   Manual therapy comments done seperate from all other aspects of treatment, supine   Soft tissue mobilization Bil upper traps, scalenes and cervical extensors     Neck Exercises: Stretches   Upper Trapezius Stretch 2 reps;30 seconds  heavy manual cueing                  PT Short Term Goals - 03/31/16 1442      PT SHORT TERM GOAL #1   Title Patient to show improvement of at least 25 degrees in all tested cervical motions in order to reduce pain and improve general QOL    Time 4   Period Weeks   Status On-going     PT SHORT TERM GOAL #2   Title Patient to demonstrate  bilateral shoulder ROM as being WFL in order to reduce pain and improve ability to perform ADLs/self-care    Time 4   Period Weeks   Status On-going     PT SHORT TERM GOAL #3   Title Patient to maintain correct posture at least 75% of the time in order to reduce discomfort and improve functional task performance skills    Time 4   Period Weeks   Status On-going     PT SHORT TERM GOAL #4   Title Patient to correctly and consistently perform appropriate HEP, to be updated PRN    Time 4   Period Weeks   Status On-going           PT Long Term Goals - 03/31/16 1443      PT LONG TERM GOAL #1   Title Patient to demonstrate cervical ROM as being within 15 degrees of normal on all measured planes in order to reduce pain and assist in improving general function   Time 8   Period Weeks   Status On-going     PT LONG TERM GOAL #2   Title Patient to experience pain no more than 2/10 during all functional tasks in order to improve QOL and faciltate return to work    Time 8   Period Weeks   Status On-going     PT LONG TERM GOAL #3   Title Patient to demonstrate at least a 70% reduction in muscle knotting and spasm in order to assist in reducing pain and posture    Time 8   Period Weeks   Status On-going     PT LONG TERM GOAL #4   Title Patient to demonstrate ability to perform all functional work related tasks with correct form and 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  Began session with MHP during therex to improve cervical mobility.  Pt required moderate multimodal cueing to improve form and reduce thoracic movements with cervical ROM based activities.  Ended session with manual soft tissue mobilization techniques to reduce tightness with vast  improvements noted with mobility and reports of pain reduced.     Rehab Potential Good   PT Frequency 2x / week   PT  Duration 8 weeks   PT Treatment/Interventions ADLs/Self Care Home Management;Biofeedback;Cryotherapy;Moist Heat;Functional mobility training;Therapeutic activities;Therapeutic exercise;Balance training;Neuromuscular re-education;Patient/family education;Manual techniques;Passive range of motion;Energy conservation;Taping   PT Next Visit Plan start with moist heat followed by manual, continue to use heavy manual cues for form. Do not add to HEP yet.     PT Home Exercise Plan 9/5- 3D cervical and thoracic excursions; REMOVED scapular and cervical retraction, shoulder shrugs DUE TO POOR FORM/INABILITY TO PERFORM CORRECTLY       Patient will benefit from skilled therapeutic intervention in order to improve the following deficits and impairments:  Improper body mechanics, Pain, Increased muscle spasms, Postural dysfunction, Decreased activity tolerance, Decreased range of motion, Decreased strength, Hypomobility, Impaired UE functional use, Impaired flexibility  Visit Diagnosis: Cervicalgia  Abnormal posture  Cramp and spasm  Other symptoms and signs involving the musculoskeletal system     Problem List Patient Active Problem List   Diagnosis Date Noted  . Myelopathy, spondylogenic, cervical 01/12/2016  . Annual physical exam 09/13/2015   Becky Saxasey Kenneth Cuaresma, LPTA; CBIS [phone]  Juel BurrowCockerham, Bethanie Bloxom Jo 04/19/2016, 9:46 AM  [facility] [address] [place], [place], [address] Phone: [phone]   Fax:  [phone]  Name: Jocelyn Jackson MRN: [id number] Date of Birth: 04-06-1951

## 2016-04-20 ENCOUNTER — Ambulatory Visit (HOSPITAL_COMMUNITY): Payer: Medicare Other

## 2016-04-20 DIAGNOSIS — R293 Abnormal posture: Secondary | ICD-10-CM

## 2016-04-20 DIAGNOSIS — R29898 Other symptoms and signs involving the musculoskeletal system: Secondary | ICD-10-CM

## 2016-04-20 DIAGNOSIS — R252 Cramp and spasm: Secondary | ICD-10-CM

## 2016-04-20 DIAGNOSIS — M542 Cervicalgia: Secondary | ICD-10-CM | POA: Diagnosis not present

## 2016-04-20 NOTE — Therapy (Signed)
[facility] [address] [place], [place], [address] Phone: [phone]   Fax:  [phone]  Physical Therapy Treatment  Patient Details  Name: Jocelyn Jackson MRN: [id number] Date of Birth: Dec 18, 1950 Referring Provider: Donalee Citrin   Encounter Date: 04/20/2016      PT End of Session - 04/20/16 0849    Visit Number 8   Number of Visits 16   Date for PT Re-Evaluation 05/04/16   Authorization Type BCBS Medicare HMO    Authorization Time Period 03/30/16 to 05/30/16   Authorization - Visit Number 8   Authorization - Number of Visits 10   PT Start Time 0846   PT Stop Time 0932   PT Time Calculation (min) 46 min   Activity Tolerance Patient tolerated treatment well   Behavior During Therapy [facility] for tasks assessed/performed      Past Medical History:  Diagnosis Date  . Anxiety   . Arthritis   . Chronic back pain   . Diabetes mellitus without complication (HCC)   . Hypertension   . Lumbar radiculopathy     Past Surgical History:  Procedure Laterality Date  . ABDOMINAL HYSTERECTOMY     cervix removed  . ANTERIOR CERVICAL DECOMP/DISCECTOMY FUSION N/A 01/12/2016   Procedure: CERVICAL THREE-FOUR. CERVICAL FOUR-FIVE, CERVICAL FIVE-SIX ANTERIOR CERVICAL DISCECTOMY/FUSION;  Surgeon: Donalee Citrin, MD;  Location: [facility];  Service: Neurosurgery;  Laterality: N/A;  . CARPAL TUNNEL RELEASE  2012   [place]  . COLONOSCOPY N/A 05/06/2015   Procedure: COLONOSCOPY;  Surgeon: Malissa Hippo, MD;  Location: [facility];  Service: Endoscopy;  Laterality: N/A;  830    There were no vitals filed for this visit.      Subjective Assessment - 04/20/16 0847    Subjective Pt stated her neck is sore today, has been moving it a lot more.  Current pain scale 6/10   Pertinent History anxiety, DM, history of LBP, HTN, anterior cervical fusion done on 01/12/16   Patient Stated Goals get rid of pain, get neck stronger    Currently in Pain? Yes   Pain Score  6    Pain Location Neck   Pain Orientation Posterior   Pain Descriptors / Indicators Sore   Pain Type Chronic pain   Pain Radiating Towards none   Pain Onset More than a month ago   Pain Frequency Intermittent   Aggravating Factors  cold weather, bending over a lot, reaching up   Pain Relieving Factors sitting down and relaxing   Effect of Pain on Daily Activities pain during ADLs/activities             [facility] Adult PT Treatment/Exercise - 04/20/16 0001      Neck Exercises: Standing   Other Standing Exercises partial quadruped thoracic rotation     Neck Exercises: Seated   Shoulder Rolls Backwards;5 reps   Other Seated Exercise cervical/thoracic 3D excursions 2x10, cues and tactile cues for form    Other Seated Exercise depression 10x     Neck Exercises: Supine   Neck Retraction 10 reps   Neck Retraction Limitations heavy manual cues    Other Supine Exercise scapular retraction 10x with verbal and visual cueing   Other Supine Exercise towel under thoracic for extension with therex and MHP complete     Moist Heat Therapy   Number Minutes Moist Heat 8 Minutes   Moist Heat Location Cervical;Other (comment)  During supine exercises initial session     Manual  Therapy   Manual Therapy Soft tissue mobilization;Passive ROM   Manual therapy comments done seperate from all other aspects of treatment, supine   Soft tissue mobilization Bil upper traps, scalenes and cervical extensors   Passive ROM all cervical directions            PT Short Term Goals - 03/31/16 1442      PT SHORT TERM GOAL #1   Title Patient to show improvement of at least 25 degrees in all tested cervical motions in order to reduce pain and improve general QOL    Time 4   Period Weeks   Status On-going     PT SHORT TERM GOAL #2   Title Patient to demonstrate bilateral shoulder ROM as being WFL in order to reduce pain and improve ability to perform ADLs/self-care    Time 4   Period Weeks   Status  On-going     PT SHORT TERM GOAL #3   Title Patient to maintain correct posture at least 75% of the time in order to reduce discomfort and improve functional task performance skills    Time 4   Period Weeks   Status On-going     PT SHORT TERM GOAL #4   Title Patient to correctly and consistently perform appropriate HEP, to be updated PRN    Time 4   Period Weeks   Status On-going           PT Long Term Goals - 03/31/16 1443      PT LONG TERM GOAL #1   Title Patient to demonstrate cervical ROM as being within 15 degrees of normal on all measured planes in order to reduce pain and assist in improving general function   Time 8   Period Weeks   Status On-going     PT LONG TERM GOAL #2   Title Patient to experience pain no more than 2/10 during all functional tasks in order to improve QOL and faciltate return to work    Time 8   Period Weeks   Status On-going     PT LONG TERM GOAL #3   Title Patient to demonstrate at least a 70% reduction in muscle knotting and spasm in order to assist in reducing pain and posture    Time 8   Period Weeks   Status On-going     PT LONG TERM GOAL #4   Title Patient to demonstrate ability to perform all functional work related tasks with correct form and biomechanics 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  Noted vast improvements with cervical mobility with thoracic mobility exercises.  Added towel roll in supine to improve thoracic extension and quadruped UE rotation to improve cervical rotation.   Continued with MHP initial session for pain control and to reduce overall tightness to improve mobility and soft tissue mobilizaiton technqiues complete at end of session to reduce overall tightness and  pain control.  Noted multiple trigger points Lt upper trap.  Discussion held with benefits of possibly attending massage therapist to address overall cervical tightness, pt given flyer with therapists in area.  No reports of increased pain through session.  Rehab Potential Good   PT Frequency 2x / week   PT Duration 8 weeks   PT Treatment/Interventions ADLs/Self Care Home Management;Biofeedback;Cryotherapy;Moist Heat;Functional mobility training;Therapeutic activities;Therapeutic exercise;Balance training;Neuromuscular re-education;Patient/family education;Manual techniques;Passive range of motion;Energy conservation;Taping   PT Next Visit Plan Reassess next session.     PT Home Exercise Plan 9/5- 3D cervical and thoracic excursions; REMOVED scapular and cervical retraction, shoulder shrugs DUE TO POOR FORM/INABILITY TO PERFORM CORRECTLY       Patient will benefit from skilled therapeutic intervention in order to improve the following deficits and impairments:  Improper body mechanics, Pain, Increased muscle spasms, Postural dysfunction, Decreased activity tolerance, Decreased range of motion, Decreased strength, Hypomobility, Impaired UE functional use, Impaired flexibility  Visit Diagnosis: Cervicalgia  Abnormal posture  Cramp and spasm  Other symptoms and signs involving the musculoskeletal system     Problem List Patient Active Problem List   Diagnosis Date Noted  . Myelopathy, spondylogenic, cervical 01/12/2016  . Annual physical exam 09/13/2015    Juel BurrowCockerham, Casey Jo 04/20/2016, 9:40 AM  [facility] [address] [place], [place], [address] Phone: [phone]   Fax:  [phone]  Name: Johny Shearsnnie H Yiu MRN: [id number] Date of Birth: March 07, 1951

## 2016-04-25 ENCOUNTER — Ambulatory Visit (HOSPITAL_COMMUNITY): Payer: Medicare Other | Admitting: Physical Therapy

## 2016-04-27 ENCOUNTER — Encounter (HOSPITAL_COMMUNITY): Payer: Medicare Other

## 2016-05-02 ENCOUNTER — Ambulatory Visit (HOSPITAL_COMMUNITY): Payer: Medicare Other

## 2016-05-02 ENCOUNTER — Encounter (HOSPITAL_COMMUNITY): Payer: Medicare Other | Admitting: Physical Therapy

## 2016-05-02 DIAGNOSIS — R293 Abnormal posture: Secondary | ICD-10-CM

## 2016-05-02 DIAGNOSIS — R29898 Other symptoms and signs involving the musculoskeletal system: Secondary | ICD-10-CM

## 2016-05-02 DIAGNOSIS — R252 Cramp and spasm: Secondary | ICD-10-CM

## 2016-05-02 DIAGNOSIS — M542 Cervicalgia: Secondary | ICD-10-CM | POA: Diagnosis not present

## 2016-05-02 NOTE — Therapy (Signed)
[place], [place], [address] Phone: [phone]   Fax:  [phone]  Physical Therapy Treatment  Patient Details  Name: Jocelyn Jackson MRN: [id number] Date of Birth: 04-18-1951 Referring Provider: Kary Kos   Encounter Date: 05/02/2016      PT End of Session - 05/02/16 1112    Visit Number 9   Number of Visits 16   Date for PT Re-Evaluation 05/30/16   Authorization Type BCBS Medicare HMO    Authorization Time Period 03/30/16 to 05/30/16 (Gcodes done 05/02/16)   Authorization - Visit Number 9   Authorization - Number of Visits 10   PT Start Time 1031   PT Stop Time 1112   PT Time Calculation (min) 41 min   Activity Tolerance Patient tolerated treatment well   Behavior During Therapy [facility] for tasks assessed/performed      Past Medical History:  Diagnosis Date  . Anxiety   . Arthritis   . Chronic back pain   . Diabetes mellitus without complication ([place])   . Hypertension   . Lumbar radiculopathy     Past Surgical History:  Procedure Laterality Date  . ABDOMINAL HYSTERECTOMY     cervix removed  . ANTERIOR CERVICAL DECOMP/DISCECTOMY FUSION N/A 01/12/2016   Procedure: CERVICAL THREE-FOUR. CERVICAL FOUR-FIVE, CERVICAL FIVE-SIX ANTERIOR CERVICAL DISCECTOMY/FUSION;  Surgeon: Kary Kos, MD;  Location: [place] [facility]UR[facility] ORS;  Service: Neurosurgery;  Laterality: N/A;  . CARPAL TUNNEL RELEASE  2012   [place]  . COLONOSCOPY N/A 05/06/2015   Procedure: COLONOSCOPY;  Surgeon: Rogene Houston, MD;  [facility];  Service: Endoscopy;  Laterality: N/A;  830    There were no vitals filed for this visit.      Subjective Assessment - 05/02/16 1034    Subjective Pt reports that she feels as though she is making progress since therapy starter. She still have unpredicatble episodes of Left hand/arm giving way during manual tasks. She reports that her pain overall has improvement but still has intermittent pain on  alternating days. Most of the soreness has resolved.  Pt is able to dress self and bath but ADL involving top of head are somewhat limited and painful.    Pertinent History anxiety, DM, history of LBP, HTN, anterior cervical fusion done on 01/12/16   How long can you sit comfortably? unlimited    How long can you stand comfortably? 30 minutes- "about the same since evaluation"   How long can you walk comfortably? Patient has been able to walk 30 minutes, now limited by fatigue, whereas she was previously limited by neck pain.    Patient Stated Goals get rid of pain, get neck stronger    Currently in Pain? Yes   Pain Score 6    Pain Location Neck   Pain Orientation Left;Right;Posterior   Pain Descriptors / Indicators Heaviness   Pain Type Surgical pain   Pain Radiating Towards back of head   Pain Onset More than a month ago   Pain Frequency Several days a week   Aggravating Factors  Performing excessive housework; first thing in morning.    Pain Relieving Factors physical activity;    Effect of Pain on Daily Activities limits herself and paces herself.             [facility] PT Assessment - 05/02/16 0001      Assessment   Medical Diagnosis Cervicalgia   Referring Provider Kary Kos    Onset Date/Surgical Date  01/12/16   Hand Dominance Right   Next MD Visit 05/04/16     Sensation   Light Touch Appears Intact  Impaired C5-7 at evaluation, now resolved.      Posture/Postural Control   Posture/Postural Control Postural limitations   Postural Limitations Rounded Shoulders;Forward head;Increased lumbar lordosis;Increased thoracic kyphosis  reduced cervical lordosis      AROM   Right Shoulder Flexion 148 Degrees  (135 at evaluation)   Right Shoulder ABduction 134 Degrees  limited by mid-trap 'pulling' (143 at eval)   Right Shoulder Internal Rotation --  T8 (T12 at eval)   Right Shoulder External Rotation --  T4 SP (was C7 at eval)   Left Shoulder Flexion 144 Degrees  (120 at eval)    Left Shoulder ABduction 116 Degrees  AROM (same as eval)   Left Shoulder Internal Rotation --  T8 (T12 at eval)   Left Shoulder External Rotation --  T4 SP (was C7 at eval)   Cervical Flexion 46  (25 at evaluation)   Cervical Extension 27  13 degrees at evaluation   Cervical - Right Side Bend 13  8 degrees at eval   Cervical - Left Side Bend 18  8 degrees at eval   Cervical - Right Rotation 50  (18 degrees at eval)   Cervical - Left Rotation 50  (20 degrees at eval)     Strength   Right Shoulder Flexion 5/5  serratus Ant 5/5   Right Shoulder ABduction 5/5  Post deltoid 5/5; mid trap 4+/5   Right Shoulder Internal Rotation 5/5   Right Shoulder External Rotation 5/5   Left Shoulder Flexion 5/5  tactile cues for scapula posturel; serratus Ant 4+/5   Left Shoulder ABduction 5/5  post delt 4/5; mid trap 3+/5   Left Shoulder Internal Rotation 5/5   Left Shoulder External Rotation 5/5                               PT Short Term Goals - 05/02/16 1106      PT SHORT TERM GOAL #1   Title Patient to show improvement of at least 25 degrees in all tested cervical motions in order to reduce pain and improve general QOL    Time 4   Period Weeks   Status Partially Met     PT SHORT TERM GOAL #2   Title Patient to demonstrate bilateral shoulder ROM as being WFL in order to reduce pain and improve ability to perform ADLs/self-care    Baseline Still has difficulty with top of head ADL; Abduction remains unchanged grossly.    Time 4   Period Weeks   Status Partially Met     PT SHORT TERM GOAL #3   Title Patient to maintain correct posture at least 75% of the time in order to reduce discomfort and improve functional task performance skills    Time 4   Period Weeks   Status Achieved     PT SHORT TERM GOAL #4   Title Patient to correctly and consistently perform appropriate HEP, to be updated PRN    Time 4   Period Weeks   Status Achieved            PT Long Term Goals - 03/31/16 1443      PT LONG TERM GOAL #1   Title Patient to demonstrate cervical ROM as being within 15 degrees of normal on all measured planes in  order to reduce pain and assist in improving general function   Time 8   Period Weeks   Status On-going     PT LONG TERM GOAL #2   Title Patient to experience pain no more than 2/10 during all functional tasks in order to improve QOL and faciltate return to work    Time 8   Period Weeks   Status On-going     PT LONG TERM GOAL #3   Title Patient to demonstrate at least a 70% reduction in muscle knotting and spasm in order to assist in reducing pain and posture    Time 8   Period Weeks   Status On-going     PT LONG TERM GOAL #4   Title Patient to demonstrate ability to perform all functional work related tasks with correct form and biomechanics 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 Pt demonstrating improvements in ROM and Strength overall. Pain remains limited, but has moved from surgical pain tfrom fascial restrictions and muslce spasms in shoulders and neck. Pt achieved 2 of 4 short term goals and partially met the other 2 goals. Overall making excellent progres toward longer term goals for DC. The patient continues to have pain limitations that prevent completion of ADL/IADL without pain. She is also limited in scapular, cervical and GHJ rom, but this continues to improve.    Rehab Potential Good   PT Frequency 2x / week   PT Duration 8 weeks   PT Treatment/Interventions ADLs/Self Care Home Management;Biofeedback;Cryotherapy;Moist Heat;Functional mobility training;Therapeutic activities;Therapeutic exercise;Balance training;Neuromuscular re-education;Patient/family education;Manual techniques;Passive range of motion;Energy conservation;Taping   PT Next Visit Plan  Update HEP as appropriate; scapulothoracic mobs, MFR to R middle/upper traps; mid trap strengthening on left, and any approiate portions of prior POC.     PT Home Exercise Plan 9/5- 3D cervical and thoracic excursions; REMOVED scapular and cervical retraction, shoulder shrugs DUE TO POOR FORM/INABILITY TO PERFORM CORRECTLY    Consulted and Agree with Plan of Care Patient      Patient will benefit from skilled therapeutic intervention in order to improve the following deficits and impairments:  Improper body mechanics, Pain, Increased muscle spasms, Postural dysfunction, Decreased activity tolerance, Decreased range of motion, Decreased strength, Hypomobility, Impaired UE functional use, Impaired flexibility  Visit Diagnosis: Cervicalgia  Abnormal posture  Cramp and spasm  Other symptoms and signs involving the musculoskeletal system       G-Codes - 05-11-16 1114    Functional Assessment Tool Used Clinical Judgment    Functional Limitation Mobility: Walking and moving around   Mobility: Walking and Moving Around Current Status [id number]) At least 20 percent but less than 40 percent impaired, limited or restricted   Mobility: Walking and Moving Around Goal Status [phone]) At least 1 percent but less than 20 percent impaired, limited or restricted      Problem List Patient Active Problem List   Diagnosis Date Noted  . Myelopathy, spondylogenic, cervical 01/12/2016  . Annual physical exam 09/13/2015    11:19 AM, 05-11-16 Etta Grandchild, PT, DPT Physical Therapist at [place] [phone] (office)     [place], [place], [address] Phone: [phone]   Fax:  [phone]  Name: ARIANNI GALLEGO MRN: [id number] Date of Birth: 09/14/1950

## 2016-05-04 ENCOUNTER — Encounter (HOSPITAL_COMMUNITY): Payer: Medicare Other | Admitting: Physical Therapy

## 2016-05-05 DIAGNOSIS — M542 Cervicalgia: Secondary | ICD-10-CM | POA: Diagnosis not present

## 2016-05-09 ENCOUNTER — Ambulatory Visit (HOSPITAL_COMMUNITY): Payer: Medicare Other | Attending: Neurosurgery

## 2016-05-09 DIAGNOSIS — M542 Cervicalgia: Secondary | ICD-10-CM | POA: Diagnosis not present

## 2016-05-09 DIAGNOSIS — R293 Abnormal posture: Secondary | ICD-10-CM | POA: Insufficient documentation

## 2016-05-09 DIAGNOSIS — R252 Cramp and spasm: Secondary | ICD-10-CM | POA: Diagnosis not present

## 2016-05-09 DIAGNOSIS — R29898 Other symptoms and signs involving the musculoskeletal system: Secondary | ICD-10-CM | POA: Diagnosis not present

## 2016-05-09 NOTE — Therapy (Signed)
[place], [place], [address] Phone: [phone]   Fax:  [phone]  Physical Therapy Treatment  Patient Details  Name: Jocelyn Jackson MRN: [id number] Date of Birth: 1951/03/27 Referring Provider: Kary Kos   Encounter Date: 05/09/2016      PT End of Session - 05/09/16 0832    Visit Number 10   Number of Visits 16   Date for PT Re-Evaluation 05/30/16   Authorization Type BCBS Medicare HMO    Authorization Time Period 03/30/16 to 05/30/16 (Gcodes done 05/02/16)   Authorization - Visit Number 10   Authorization - Number of Visits 19   PT Start Time 0818   PT Stop Time 0858   PT Time Calculation (min) 40 min   Activity Tolerance Patient tolerated treatment well   Behavior During Therapy [facility] for tasks assessed/performed      Past Medical History:  Diagnosis Date  . Anxiety   . Arthritis   . Chronic back pain   . Diabetes mellitus without complication ([place])   . Hypertension   . Lumbar radiculopathy     Past Surgical History:  Procedure Laterality Date  . ABDOMINAL HYSTERECTOMY     cervix removed  . ANTERIOR CERVICAL DECOMP/DISCECTOMY FUSION N/A 01/12/2016   Procedure: CERVICAL THREE-FOUR. CERVICAL FOUR-FIVE, CERVICAL FIVE-SIX ANTERIOR CERVICAL DISCECTOMY/FUSION;  Surgeon: Kary Kos, MD;  Location: [place] NEURO ORS;  Service: Neurosurgery;  Laterality: N/A;  . CARPAL TUNNEL RELEASE  2012   [place]  . COLONOSCOPY N/A 05/06/2015   Procedure: COLONOSCOPY;  Surgeon: Rogene Houston, MD;  [facility];  Service: Endoscopy;  Laterality: N/A;  830    There were no vitals filed for this visit.      Subjective Assessment - 05/09/16 0821    Subjective Pt stated "her left arm feels weird" with difficulty describing the feeling, feels difficulty holding onto objects.  Current pain scale 6/10 mid back Lt side.   Pertinent History anxiety, DM, history of LBP, HTN, anterior cervical fusion done on 01/12/16   Patient Stated Goals get rid of pain, get neck stronger    Currently in Pain? Yes   Pain Score 6    Pain Location Neck   Pain Orientation Left;Posterior;Mid   Pain Descriptors / Indicators Nagging   Pain Type Surgical pain   Pain Radiating Towards back of head   Pain Onset More than a month ago   Pain Frequency Several days a week   Aggravating Factors  Performing excessive housework, first thing in morning   Pain Relieving Factors physical activitiy   Effect of Pain on Daily Activities limited herself and paces herself            [facility] Adult PT Treatment/Exercise - 05/09/16 0001      Neck Exercises: Seated   Other Seated Exercise 3D cervical excrusion with multimodal cueing 15x     Neck Exercises: Prone   W Back 10 reps   Shoulder Extension 10 reps   Rows Limitations 15     Modalities   Modalities Moist Heat     Moist Heat Therapy   Number Minutes Moist Heat 8 Minutes   Moist Heat Location Cervical;Other (comment)     Manual Therapy   Manual Therapy Soft tissue mobilization;Passive ROM   Manual therapy comments done seperate from all other aspects of treatment, supine   Soft tissue mobilization Lt upper and mid trap, cervical extensor, rhomboid  PT Short Term Goals - 05/02/16 1106      PT SHORT TERM GOAL #1   Title Patient to show improvement of at least 25 degrees in all tested cervical motions in order to reduce pain and improve general QOL    Time 4   Period Weeks   Status Partially Met     PT SHORT TERM GOAL #2   Title Patient to demonstrate bilateral shoulder ROM as being [facility] in order to reduce pain and improve ability to perform ADLs/self-care    Baseline Still has difficulty with top of head ADL; Abduction remains unchanged grossly.    Time 4   Period Weeks   Status Partially Met     PT SHORT TERM GOAL #3   Title Patient to maintain correct posture at least 75% of the time in order to reduce discomfort and improve functional  task performance skills    Time 4   Period Weeks   Status Achieved     PT SHORT TERM GOAL #4   Title Patient to correctly and consistently perform appropriate HEP, to be updated PRN    Time 4   Period Weeks   Status Achieved           PT Long Term Goals - 03/31/16 1443      PT LONG TERM GOAL #1   Title Patient to demonstrate cervical ROM as being within 15 degrees of normal on all measured planes in order to reduce pain and assist in improving general function   Time 8   Period Weeks   Status On-going     PT LONG TERM GOAL #2   Title Patient to experience pain no more than 2/10 during all functional tasks in order to improve QOL and faciltate return to work    Time 8   Period Weeks   Status On-going     PT LONG TERM GOAL #3   Title Patient to demonstrate at least a 70% reduction in muscle knotting and spasm in order to assist in reducing pain and posture    Time 8   Period Weeks   Status On-going     PT LONG TERM GOAL #4   Title Patient to demonstrate ability to perform all functional work related tasks with correct form and 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  Pt continues to require therapist facilitation to improve technqiue and form with majority of therex.  Ended session with manual to upper and mid trap region to reduce spasms and overall tightness with reports of pain reduced following manual.   Rehab Potential Good   PT Frequency 2x / week   PT Duration 8 weeks   PT Treatment/Interventions ADLs/Self Care Home Management;Biofeedback;Cryotherapy;Moist Heat;Functional mobility training;Therapeutic activities;Therapeutic exercise;Balance training;Neuromuscular re-education;Patient/family education;Manual techniques;Passive range of motion;Energy  conservation;Taping   PT Next Visit Plan Update HEP as appropriate; scapulothoracic mobs, MFR to R middle/upper traps; mid trap strengthening on left, and any approiate portions of prior POC.     PT Home Exercise Plan 9/5- 3D cervical and thoracic excursions; REMOVED scapular and cervical retraction, shoulder shrugs DUE TO POOR FORM/INABILITY TO PERFORM CORRECTLY       Patient will benefit from skilled  therapeutic intervention in order to improve the following deficits and impairments:  Improper body mechanics, Pain, Increased muscle spasms, Postural dysfunction, Decreased activity tolerance, Decreased range of motion, Decreased strength, Hypomobility, Impaired UE functional use, Impaired flexibility  Visit Diagnosis: Cervicalgia  Abnormal posture  Cramp and spasm  Other symptoms and signs involving the musculoskeletal system     Problem List Patient Active Problem List   Diagnosis Date Noted  . Myelopathy, spondylogenic, cervical 01/12/2016  . Annual physical exam 09/13/2015   Ihor Austin, [place]; [place]  Aldona Lento 05/09/2016, 9:20 AM  [place], [place], [address] Phone: [phone]   Fax:  [phone]  Name: MIKITA LESMEISTER MRN: [id number] Date of Birth: April 07, 1951

## 2016-05-15 ENCOUNTER — Ambulatory Visit (HOSPITAL_COMMUNITY): Payer: Medicare Other | Admitting: Physical Therapy

## 2016-05-15 ENCOUNTER — Encounter (HOSPITAL_COMMUNITY): Payer: Self-pay | Admitting: Physical Therapy

## 2016-05-15 DIAGNOSIS — M542 Cervicalgia: Secondary | ICD-10-CM

## 2016-05-15 DIAGNOSIS — R29898 Other symptoms and signs involving the musculoskeletal system: Secondary | ICD-10-CM

## 2016-05-15 DIAGNOSIS — R293 Abnormal posture: Secondary | ICD-10-CM

## 2016-05-15 DIAGNOSIS — R252 Cramp and spasm: Secondary | ICD-10-CM

## 2016-05-15 NOTE — Therapy (Signed)
[place], [place], [address] Phone: [phone]   Fax:  [phone]  Physical Therapy Treatment  Patient Details  Name: Jocelyn Jackson MRN: [id number] Date of Birth: [birth year] Referring Provider: Kary Kos   Encounter Date: 05/15/2016      PT End of Session - 05/15/16 0858    Visit Number 11   Number of Visits 16   Date for PT Re-Evaluation 05/30/16   Authorization Type BCBS Medicare HMO    Authorization Time Period 03/30/16 to 05/30/16 (Gcodes done 05/02/16)   Authorization - Visit Number 11   Authorization - Number of Visits 19   PT Start Time 0813   PT Stop Time 6837   PT Time Calculation (min) 45 min   Activity Tolerance Patient tolerated treatment well   Behavior During Therapy [facility] for tasks assessed/performed      Past Medical History:  Diagnosis Date  . Anxiety   . Arthritis   . Chronic back pain   . Diabetes mellitus without complication ([place])   . Hypertension   . Lumbar radiculopathy     Past Surgical History:  Procedure Laterality Date  . ABDOMINAL HYSTERECTOMY     cervix removed  . ANTERIOR CERVICAL DECOMP/DISCECTOMY FUSION N/A 01/12/2016   Procedure: CERVICAL THREE-FOUR. CERVICAL FOUR-FIVE, CERVICAL FIVE-SIX ANTERIOR CERVICAL DISCECTOMY/FUSION;  Surgeon: Kary Kos, MD;  Location: [place] [facility];  Service: Neurosurgery;  Laterality: N/A;  . CARPAL TUNNEL RELEASE  2012   [place]  . COLONOSCOPY N/A 05/06/2015   Procedure: COLONOSCOPY;  Surgeon: Rogene Houston, MD;  [facility];  Service: Endoscopy;  Laterality: N/A;  830    There were no vitals filed for this visit.      Subjective Assessment - 05/15/16 0814    Subjective Pt states her pain is about a 6/10 due to the weather.  She wants to be able to move her neck better.    Pertinent History anxiety, DM, history of LBP, HTN, anterior cervical fusion done on 01/12/16   Patient Stated Goals get rid of pain, get neck stronger    Pain Score 6    Pain Location Neck   Pain Orientation Left   Pain Descriptors / Indicators Sore   Pain Onset More than a month ago                         [facility] Adult PT Treatment/Exercise - 05/15/16 0001      Neck Exercises: Machines for Strengthening   UBE (Upper Arm Bike) 4' backward      Neck Exercises: Standing   Other Standing Exercises wall arch take arms off wall for lower trap strengthening x 10     Neck Exercises: Seated   Neck Retraction 5 reps   Cervical Rotation 5 reps   Lateral Flexion 5 reps   X to V 5 reps   W Back Weights (lbs) 5   Other Seated Exercise scapular retraction cervical extension x 5 each     Neck Exercises: Supine   Neck Retraction 5 reps   Lateral Flexion Both;10 reps     Neck Exercises: Prone   Axial Exentsion 5 reps   Shoulder Extension 10 reps   Rows Limitations 10     Manual Therapy   Manual Therapy Soft tissue mobilization;Passive ROM   Manual therapy comments done seperate from all other aspects of treatment, supine   Joint Mobilization c2-c7   Soft  tissue mobilization Lt upper and mid trap, cervical extensor, rhomboid   Passive ROM all cervical directions                  PT Short Term Goals - 05/02/16 1106      PT SHORT TERM GOAL #1   Title Patient to show improvement of at least 25 degrees in all tested cervical motions in order to reduce pain and improve general QOL    Time 4   Period Weeks   Status Partially Met     PT SHORT TERM GOAL #2   Title Patient to demonstrate bilateral shoulder ROM as being [facility] in order to reduce pain and improve ability to perform ADLs/self-care    Baseline Still has difficulty with top of head ADL; Abduction remains unchanged grossly.    Time 4   Period Weeks   Status Partially Met     PT SHORT TERM GOAL #3   Title Patient to maintain correct posture at least 75% of the time in order to reduce discomfort and improve functional task performance skills    Time 4    Period Weeks   Status Achieved     PT SHORT TERM GOAL #4   Title Patient to correctly and consistently perform appropriate HEP, to be updated PRN    Time 4   Period Weeks   Status Achieved           PT Long Term Goals - 03/31/16 1443      PT LONG TERM GOAL #1   Title Patient to demonstrate cervical ROM as being within 15 degrees of normal on all measured planes in order to reduce pain and assist in improving general function   Time 8   Period Weeks   Status On-going     PT LONG TERM GOAL #2   Title Patient to experience pain no more than 2/10 during all functional tasks in order to improve QOL and faciltate return to work    Time 8   Period Weeks   Status On-going     PT LONG TERM GOAL #3   Title Patient to demonstrate at least a 70% reduction in muscle knotting and spasm in order to assist in reducing pain and posture    Time 8   Period Weeks   Status On-going     PT LONG TERM GOAL #4   Title Patient to demonstrate ability to perform all functional work related tasks with correct form and biomechanics 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   Pt still has difficulty with coordination of all exercises but was able to perform a supine scapular retraction exercise properly after several attempts.    Rehab Potential Good   PT Frequency 2x / week   PT Duration 8 weeks   PT Treatment/Interventions ADLs/Self Care Home Management;Biofeedback;Cryotherapy;Moist Heat;Functional mobility training;Therapeutic activities;Therapeutic exercise;Balance training;Neuromuscular re-education;Patient/family education;Manual techniques;Passive range of motion;Energy conservation;Taping   PT Next Visit Plan Update HEP as appropriate; scapulothoracic mobs, MFR to R  middle/upper traps; mid trap strengthening on left, and any approiate portions of prior POC.     PT Home Exercise Plan 9/5- 3D cervical and  thoracic excursions; REMOVED scapular and cervical retraction, shoulder shrugs DUE TO POOR FORM/INABILITY TO PERFORM CORRECTLY       Patient will benefit from skilled therapeutic intervention in order to improve the following deficits and impairments:  Improper body mechanics, Pain, Increased muscle spasms, Postural dysfunction, Decreased activity tolerance, Decreased range of motion, Decreased strength, Hypomobility, Impaired UE functional use, Impaired flexibility  Visit Diagnosis: Cervicalgia  Abnormal posture  Cramp and spasm  Other symptoms and signs involving the musculoskeletal system     Problem List Patient Active Problem List   Diagnosis Date Noted  . Myelopathy, spondylogenic, cervical 01/12/2016  . Annual physical exam 09/13/2015   Rayetta Humphrey, PT CLT [phone] 05/15/2016, 9:01 AM  [place], [place], [address] Phone: [phone]   Fax:  [phone]  Name: MARLAYSIA LENIG MRN: [id number] Date of Birth: [birth year]

## 2016-05-17 ENCOUNTER — Ambulatory Visit (HOSPITAL_COMMUNITY): Payer: Medicare Other

## 2016-05-17 DIAGNOSIS — M542 Cervicalgia: Secondary | ICD-10-CM | POA: Diagnosis not present

## 2016-05-17 DIAGNOSIS — R252 Cramp and spasm: Secondary | ICD-10-CM

## 2016-05-17 DIAGNOSIS — R293 Abnormal posture: Secondary | ICD-10-CM

## 2016-05-17 DIAGNOSIS — R29898 Other symptoms and signs involving the musculoskeletal system: Secondary | ICD-10-CM

## 2016-05-17 NOTE — Therapy (Signed)
Jocelyn Jackson, [place], [address] Phone: [phone]   Fax:  [phone]  Physical Therapy Treatment  Patient Details  Name: Jocelyn Jackson MRN: [id number] Date of Birth: 1950/11/07 Referring Provider: Kary Kos   Encounter Date: 05/17/2016      PT End of Session - 05/17/16 0825    Visit Number 12   Number of Visits 16   Date for PT Re-Evaluation 05/30/16   Authorization Type BCBS Medicare HMO    Authorization Time Period 03/30/16 to 05/30/16 (Gcodes done 05/02/16)   Authorization - Visit Number 12   Authorization - Number of Visits 19   PT Start Time 0817   PT Stop Time 0856   PT Time Calculation (min) 39 min   Activity Tolerance Patient tolerated treatment well   Behavior During Therapy [facility] for tasks assessed/performed      Past Medical History:  Diagnosis Date  . Anxiety   . Arthritis   . Chronic back pain   . Diabetes mellitus without complication ([place])   . Hypertension   . Lumbar radiculopathy     Past Surgical History:  Procedure Laterality Date  . ABDOMINAL HYSTERECTOMY     cervix removed  . ANTERIOR CERVICAL DECOMP/DISCECTOMY FUSION N/A 01/12/2016   Procedure: CERVICAL THREE-FOUR. CERVICAL FOUR-FIVE, CERVICAL FIVE-SIX ANTERIOR CERVICAL DISCECTOMY/FUSION;  Surgeon: Kary Kos, MD;  Location: [place] [facility];  Service: Neurosurgery;  Laterality: N/A;  . CARPAL TUNNEL RELEASE  2012   [place]  . COLONOSCOPY N/A 05/06/2015   Procedure: COLONOSCOPY;  Surgeon: Rogene Houston, MD;  [facility];  Service: Endoscopy;  Laterality: N/A;  830    There were no vitals filed for this visit.      Subjective Assessment - 05/17/16 0816    Subjective Pt reports the scar on anterior neck with increased pain this morning.  Feels her ROM is improving though continues to have limits mobility.     Pertinent History anxiety, DM, history of LBP, HTN, anterior cervical fusion done on 01/12/16   Patient Stated  Goals get rid of pain, get neck stronger    Currently in Pain? Yes   Pain Score 7    Pain Location Neck   Pain Orientation Left;Anterior;Right   Pain Descriptors / Indicators Sore   Pain Type Surgical pain   Pain Radiating Towards back of head   Pain Onset More than a month ago   Pain Frequency Several days a week   Aggravating Factors  Performing excessive housework, first thing in morning   Pain Relieving Factors physical activity   Effect of Pain on Daily Activities limited herself and paces herself                         [facility] Adult PT Treatment/Exercise - 05/17/16 0001      Neck Exercises: Machines for Strengthening   UBE (Upper Arm Bike) 4' backward      Neck Exercises: Standing   Other Standing Exercises wall arch take arms off wall for lower trap strengthening x 10     Neck Exercises: Seated   Neck Retraction 5 reps   Neck Retraction Limitations tactile and verbal cueing   Cervical Rotation 10 reps   Lateral Flexion 5 reps   Lateral Flexion Limitations tactile and verbal cueing   X to V 10 reps   W Back Weights (lbs) 10x     Neck Exercises: Prone   Axial  Exentsion 5 reps   Shoulder Extension 10 reps   Rows Limitations 10     Manual Therapy   Manual Therapy Soft tissue mobilization;Passive ROM   Manual therapy comments done seperate from all other aspects of treatment, supine   Soft tissue mobilization Lt upper and mid trap, cervical extensor, rhomboid   Passive ROM all cervical directions       Supine: cervical and scapular retraction with multimodal cueing 10x           PT Short Term Goals - 05/02/16 1106      PT SHORT TERM GOAL #1   Title Patient to show improvement of at least 25 degrees in all tested cervical motions in order to reduce pain and improve general QOL    Time 4   Period Weeks   Status Partially Met     PT SHORT TERM GOAL #2   Title Patient to demonstrate bilateral shoulder ROM as being [facility] in order to reduce  pain and improve ability to perform ADLs/self-care    Baseline Still has difficulty with top of head ADL; Abduction remains unchanged grossly.    Time 4   Period Weeks   Status Partially Met     PT SHORT TERM GOAL #3   Title Patient to maintain correct posture at least 75% of the time in order to reduce discomfort and improve functional task performance skills    Time 4   Period Weeks   Status Achieved     PT SHORT TERM GOAL #4   Title Patient to correctly and consistently perform appropriate HEP, to be updated PRN    Time 4   Period Weeks   Status Achieved           PT Long Term Goals - 03/31/16 1443      PT LONG TERM GOAL #1   Title Patient to demonstrate cervical ROM as being within 15 degrees of normal on all measured planes in order to reduce pain and assist in improving general function   Time 8   Period Weeks   Status On-going     PT LONG TERM GOAL #2   Title Patient to experience pain no more than 2/10 during all functional tasks in order to improve QOL and faciltate return to work    Time 8   Period Weeks   Status On-going     PT LONG TERM GOAL #3   Title Patient to demonstrate at least a 70% reduction in muscle knotting and spasm in order to assist in reducing pain and posture    Time 8   Period Weeks   Status On-going     PT LONG TERM GOAL #4   Title Patient to demonstrate ability to perform all functional work related tasks with correct form and 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  Pt continues to have significant difficulty with proper form with all exercises requiring verbal and tactile cueing.  Ended session with manual soft tissue mobilizaiton and passive stretches to improve cervical mobility and reduce pain.     Rehab  Potential Good   PT Frequency 2x / week   PT Duration 8 weeks  PT Treatment/Interventions ADLs/Self Care Home Management;Biofeedback;Cryotherapy;Moist Heat;Functional mobility training;Therapeutic activities;Therapeutic exercise;Balance training;Neuromuscular re-education;Patient/family education;Manual techniques;Passive range of motion;Energy conservation;Taping   PT Next Visit Plan Update HEP as appropriate; scapulothoracic mobs, MFR to R middle/upper traps; mid trap strengthening on left, and any approiate portions of prior POC.     PT Home Exercise Plan 9/5- 3D cervical and thoracic excursions; REMOVED scapular and cervical retraction, shoulder shrugs DUE TO POOR FORM/INABILITY TO PERFORM CORRECTLY       Patient will benefit from skilled therapeutic intervention in order to improve the following deficits and impairments:  Improper body mechanics, Pain, Increased muscle spasms, Postural dysfunction, Decreased activity tolerance, Decreased range of motion, Decreased strength, Hypomobility, Impaired UE functional use, Impaired flexibility  Visit Diagnosis: Cervicalgia  Abnormal posture  Cramp and spasm  Other symptoms and signs involving the musculoskeletal system     Problem List Patient Active Problem List   Diagnosis Date Noted  . Myelopathy, spondylogenic, cervical 01/12/2016  . Annual physical exam 09/13/2015   Ihor Austin, [place]; [place]  Aldona Lento 05/17/2016, 8:56 AM  [place], [place], [address] Phone: [phone]   Fax:  [phone]  Name: Jocelyn Jackson MRN: [id number] Date of Birth: 04-30-1951

## 2016-05-24 ENCOUNTER — Ambulatory Visit (HOSPITAL_COMMUNITY): Payer: Medicare Other

## 2016-05-24 DIAGNOSIS — M542 Cervicalgia: Secondary | ICD-10-CM | POA: Diagnosis not present

## 2016-05-24 DIAGNOSIS — R252 Cramp and spasm: Secondary | ICD-10-CM

## 2016-05-24 DIAGNOSIS — R293 Abnormal posture: Secondary | ICD-10-CM

## 2016-05-24 DIAGNOSIS — R29898 Other symptoms and signs involving the musculoskeletal system: Secondary | ICD-10-CM

## 2016-05-24 NOTE — Therapy (Signed)
[place], [place], [address] Phone: [phone]   Fax:  [phone]  Physical Therapy Treatment  Patient Details  Name: Jocelyn Jackson MRN: [id number] Date of Birth: 11/25/1950 Referring Provider: Kary Kos   Encounter Date: 05/24/2016      PT End of Session - 05/24/16 0902    Visit Number 13   Number of Visits 16   Date for PT Re-Evaluation 05/30/16   Authorization Type BCBS Medicare HMO    Authorization Time Period 03/30/16 to 05/30/16 (Gcodes done 05/02/16)   Authorization - Visit Number 13   Authorization - Number of Visits 19   PT Start Time 0814   PT Stop Time 0858   PT Time Calculation (min) 44 min   Activity Tolerance Patient tolerated treatment well   Behavior During Therapy [facility] for tasks assessed/performed      Past Medical History:  Diagnosis Date  . Anxiety   . Arthritis   . Chronic back pain   . Diabetes mellitus without complication ([place])   . Hypertension   . Lumbar radiculopathy     Past Surgical History:  Procedure Laterality Date  . ABDOMINAL HYSTERECTOMY     cervix removed  . ANTERIOR CERVICAL DECOMP/DISCECTOMY FUSION N/A 01/12/2016   Procedure: CERVICAL THREE-FOUR. CERVICAL FOUR-FIVE, CERVICAL FIVE-SIX ANTERIOR CERVICAL DISCECTOMY/FUSION;  Surgeon: Kary Kos, MD;  Location: [place] [facility];  Service: Neurosurgery;  Laterality: N/A;  . CARPAL TUNNEL RELEASE  2012   [place]  . COLONOSCOPY N/A 05/06/2015   Procedure: COLONOSCOPY;  Surgeon: Rogene Houston, MD;  [facility];  Service: Endoscopy;  Laterality: N/A;  830    There were no vitals filed for this visit.      Subjective Assessment - 05/24/16 0820    Subjective Pt stated she got dizzy following last session, plans to make MD apt to address.  Pt stated the scar on front of neck feels like it is pulling, current pain on posterior neck pain scale 7/10.   Pertinent History anxiety, DM, history of LBP, HTN, anterior  cervical fusion done on 01/12/16   Patient Stated Goals get rid of pain, get neck stronger    Currently in Pain? Yes   Pain Score 7    Pain Location Neck   Pain Orientation Anterior;Posterior   Pain Descriptors / Indicators Sore;Tender   Pain Type Surgical pain   Pain Radiating Towards back of head    Pain Onset More than a month ago   Pain Frequency Several days a week   Aggravating Factors  Performing excessive housework, first thing in morning   Pain Relieving Factors physical acitivity   Effect of Pain on Daily Activities limits activity and paces herself            [facility] PT Assessment - 05/24/16 0001      Observation/Other Assessments   Observations BP 152/76 mmHg seated position                     [facility] Adult PT Treatment/Exercise - 05/24/16 0001      Neck Exercises: Machines for Strengthening   UBE (Upper Arm Bike) 5' backward      Neck Exercises: Standing   Other Standing Exercises wall arch take arms off wall for lower trap strengthening x 10     Neck Exercises: Seated   Neck Retraction 5 reps   Neck Retraction Limitations tactile and verbal cueing   Cervical Rotation 10  reps   Lateral Flexion 10 reps   Lateral Flexion Limitations tactile and verbal cueing   X to V 10 reps   W Back Weights (lbs) 10     Neck Exercises: Supine   Neck Retraction 5 reps  2 sets   Neck Retraction Limitations heavy manual cues    Other Supine Exercise scapular retraction 10x with verbal and visual cueing     Neck Exercises: Prone   Other Prone Exercise held prone due to reports of increased dizziness following      Manual Therapy   Manual Therapy Myofascial release;Soft tissue mobilization;Other (comment)   Manual therapy comments done seperate from all other aspects of treatment, supine   Soft tissue mobilization upper and mid trap, cervical extensor, rhomboid   Myofascial Release scar massage anterior neck   Passive ROM all cervical directions   Other Manual  Therapy suboccipital release                  PT Short Term Goals - 05/02/16 1106      PT SHORT TERM GOAL #1   Title Patient to show improvement of at least 25 degrees in all tested cervical motions in order to reduce pain and improve general QOL    Time 4   Period Weeks   Status Partially Met     PT SHORT TERM GOAL #2   Title Patient to demonstrate bilateral shoulder ROM as being [facility] in order to reduce pain and improve ability to perform ADLs/self-care    Baseline Still has difficulty with top of head ADL; Abduction remains unchanged grossly.    Time 4   Period Weeks   Status Partially Met     PT SHORT TERM GOAL #3   Title Patient to maintain correct posture at least 75% of the time in order to reduce discomfort and improve functional task performance skills    Time 4   Period Weeks   Status Achieved     PT SHORT TERM GOAL #4   Title Patient to correctly and consistently perform appropriate HEP, to be updated PRN    Time 4   Period Weeks   Status Achieved           PT Long Term Goals - 03/31/16 1443      PT LONG TERM GOAL #1   Title Patient to demonstrate cervical ROM as being within 15 degrees of normal on all measured planes in order to reduce pain and assist in improving general function   Time 8   Period Weeks   Status On-going     PT LONG TERM GOAL #2   Title Patient to experience pain no more than 2/10 during all functional tasks in order to improve QOL and faciltate return to work    Time 8   Period Weeks   Status On-going     PT LONG TERM GOAL #3   Title Patient to demonstrate at least a 70% reduction in muscle knotting and spasm in order to assist in reducing pain and posture    Time 8   Period Weeks   Status On-going     PT LONG TERM GOAL #4   Title Patient to demonstrate ability to perform all functional work related tasks with correct form and biomechanics in 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  Pt continues to required significant amount of verbal, visual and tactile cueing with all therex, no additional HEP given this session due to inability to perform correctly.  Ended session with manual myofascial release techniques addressing scar tissue anterior neck and soft tissue mobilization techniques to upper and mid traps as well as suboccipital release technique.  Noted significant tightness on suboccipital region.  Held prone exercises this session due to reports of increased dizziness folllowing last session and pt feels may be related.  BP taken 152/76 mmHg.  Pt encouraged to call MD concerning dizzy episodes.     Rehab Potential Good   PT Frequency 2x / week   PT Duration 8 weeks   PT Treatment/Interventions ADLs/Self Care Home Management;Biofeedback;Cryotherapy;Moist Heat;Functional mobility training;Therapeutic activities;Therapeutic exercise;Balance training;Neuromuscular re-education;Patient/family education;Manual techniques;Passive range of motion;Energy conservation;Taping   PT Next Visit Plan Update HEP as appropriate; scapulothoracic mobs, MFR to R middle/upper traps; mid trap strengthening on left, and any approiate portions of prior POC.     PT Home Exercise Plan 9/5- 3D cervical and thoracic excursions; REMOVED scapular and cervical retraction, shoulder shrugs DUE TO POOR FORM/INABILITY TO PERFORM CORRECTLY       Patient will benefit from skilled therapeutic intervention in order to improve the following deficits and impairments:  Improper body mechanics, Pain, Increased muscle spasms, Postural dysfunction, Decreased activity tolerance, Decreased range of motion, Decreased strength, Hypomobility, Impaired UE functional use, Impaired flexibility  Visit Diagnosis: Cervicalgia  Abnormal posture  Cramp and  spasm  Other symptoms and signs involving the musculoskeletal system     Problem List Patient Active Problem List   Diagnosis Date Noted  . Myelopathy, spondylogenic, cervical 01/12/2016  . Annual physical exam 09/13/2015   Ihor Austin, [place]; [place]  Aldona Lento 05/24/2016, 9:56 AM  [place], [place], [address] Phone: [phone]   Fax:  [phone]  Name: YASENIA REEDY MRN: [id number] Date of Birth: March 23, 1951

## 2016-05-25 DIAGNOSIS — Z23 Encounter for immunization: Secondary | ICD-10-CM | POA: Diagnosis not present

## 2016-05-26 ENCOUNTER — Ambulatory Visit (HOSPITAL_COMMUNITY): Payer: Medicare Other

## 2016-05-29 ENCOUNTER — Other Ambulatory Visit (HOSPITAL_COMMUNITY): Payer: Self-pay | Admitting: Internal Medicine

## 2016-05-29 ENCOUNTER — Ambulatory Visit (HOSPITAL_COMMUNITY): Payer: Medicare Other | Admitting: Physical Therapy

## 2016-05-29 DIAGNOSIS — R293 Abnormal posture: Secondary | ICD-10-CM

## 2016-05-29 DIAGNOSIS — M542 Cervicalgia: Secondary | ICD-10-CM

## 2016-05-29 DIAGNOSIS — R252 Cramp and spasm: Secondary | ICD-10-CM

## 2016-05-29 DIAGNOSIS — Z1231 Encounter for screening mammogram for malignant neoplasm of breast: Secondary | ICD-10-CM

## 2016-05-29 DIAGNOSIS — R29898 Other symptoms and signs involving the musculoskeletal system: Secondary | ICD-10-CM

## 2016-05-29 NOTE — Therapy (Signed)
[place], [place], [address] Phone: [phone]   Fax:  [phone]  Physical Therapy Treatment (Discharge)  Patient Details  Name: Jocelyn Jackson MRN: [id number] Date of Birth: 1950/09/21 Referring Provider: Kary Kos   Encounter Date: Jun 20, 2016      PT End of Session - 06-20-2016 0853    Visit Number 14   Number of Visits 14   Authorization Type [employer] Medicare HMO    Authorization Time Period 04-21-16 to 2016/06/21 (G-codes done 06/20/16)   Authorization - Visit Number 14   Authorization - Number of Visits 19   PT Start Time 6015   PT Stop Time 6153  DC today, no further skilled PT services appropriate    PT Time Calculation (min) 30 min   Activity Tolerance Patient tolerated treatment well   Behavior During Therapy [facility] for tasks assessed/performed      Past Medical History:  Diagnosis Date  . Anxiety   . Arthritis   . Chronic back pain   . Diabetes mellitus without complication ([place])   . Hypertension   . Lumbar radiculopathy     Past Surgical History:  Procedure Laterality Date  . ABDOMINAL HYSTERECTOMY     cervix removed  . ANTERIOR CERVICAL DECOMP/DISCECTOMY FUSION N/A 01/12/2016   Procedure: CERVICAL THREE-FOUR. CERVICAL FOUR-FIVE, CERVICAL FIVE-SIX ANTERIOR CERVICAL DISCECTOMY/FUSION;  Surgeon: Kary Kos, MD;  Location: [place];  Service: Neurosurgery;  Laterality: N/A;  . CARPAL TUNNEL RELEASE  2012   [place]  . COLONOSCOPY N/A 05/06/2015   Procedure: COLONOSCOPY;  Surgeon: Rogene Houston, MD;  [facility]: [facility];  Service: Endoscopy;  Laterality: N/A;  830    There were no vitals filed for this visit.      Subjective Assessment - 06-20-2016 0819    Subjective Patient arrives stating that she feels a lot better, her scar is better but she does continue to feel like she has some pulling in the back of her head. She has an appointment with her MD in December (soonest she could get), but  states the dizziness is fairly rare. Her arm is still bothering her, she is still dropping things.    Pertinent History anxiety, DM, history of LBP, HTN, anterior cervical fusion done on 01/12/16   How long can you sit comfortably? 2023/06/21- unlimited    How long can you stand comfortably? 06-21-2023- initially states 10 minutes but later states 30 minutes    How long can you walk comfortably? 06-21-2023-  15 minutes due to fatigue    Patient Stated Goals get rid of pain, get neck stronger    Currently in Pain? Yes   Pain Score 7    Pain Location Neck   Pain Orientation Posterior   Pain Descriptors / Indicators Nagging;Sore   Pain Type Surgical pain   Pain Radiating Towards pulling in back of head    Pain Onset More than a month ago   Pain Frequency Several days a week   Aggravating Factors  she is not sure, it goes and comes    Pain Relieving Factors not handling anything in L UE    Effect of Pain on Daily Activities tries to do as much as she can but she does limit herself             [facility] PT Assessment - 06-20-16 0001      Observation/Other Assessments   Observations reduced L grip strength per manual test  AROM   Right Shoulder Flexion 148 Degrees   Right Shoulder ABduction 142 Degrees   Right Shoulder Internal Rotation --  T10   Right Shoulder External Rotation --  T3    Left Shoulder Flexion 142 Degrees   Left Shoulder ABduction 112 Degrees   Left Shoulder Internal Rotation --  T10   Left Shoulder External Rotation --  T3   Cervical Flexion 50   Cervical Extension 28   Cervical - Right Side Bend 21   Cervical - Left Side Bend 18   Cervical - Right Rotation 72   Cervical - Left Rotation 48     Strength   Right Shoulder Flexion 5/5   Right Shoulder ABduction 5/5   Right Shoulder Internal Rotation 5/5   Right Shoulder External Rotation 5/5   Left Shoulder Flexion 4+/5   Left Shoulder ABduction 4+/5   Left Shoulder Internal Rotation 5/5   Left Shoulder External  Rotation 5/5                             PT Education - 05/29/16 0853    Education provided Yes   Education Details DC today, updated HEP, massage therapy contact info    Person(s) Educated Patient   Methods Explanation;Handout;Demonstration   Comprehension Verbalized understanding;Returned demonstration          PT Short Term Goals - 05/29/16 0833      PT SHORT TERM GOAL #1   Title Patient to show improvement of at least 25 degrees in all tested cervical motions in order to reduce pain and improve general QOL    Time 4   Period Weeks   Status Partially Met     PT SHORT TERM GOAL #2   Title Patient to demonstrate bilateral shoulder ROM as being [facility] in order to reduce pain and improve ability to perform ADLs/self-care    Baseline 10/23-  not quite [facility] but she reports she had shoulder ROM issues in her past anyway    Time 4   Period Weeks   Status Partially Met     PT SHORT TERM GOAL #3   Title Patient to maintain correct posture at least 75% of the time in order to reduce discomfort and improve functional task performance skills    Time 4   Period Weeks   Status On-going     PT SHORT TERM GOAL #4   Title Patient to correctly and consistently perform appropriate HEP, to be updated PRN    Baseline 10/23- reports compliance    Time 4   Period Weeks   Status Achieved           PT Long Term Goals - 05/29/16 3500      PT LONG TERM GOAL #1   Title Patient to demonstrate cervical ROM as being within 15 degrees of normal on all measured planes in order to reduce pain and assist in improving general function   Time 8   Period Weeks   Status Partially Met     PT LONG TERM GOAL #2   Title Patient to experience pain no more than 2/10 during all functional tasks in order to improve QOL and faciltate return to work    Baseline 10/23- patient continues state 6-7/10 pain throughout the day, 5/10 at best    Time 8   Period Weeks   Status On-going      PT LONG TERM GOAL #3  Title Patient to demonstrate at least a 70% reduction in muscle knotting and spasm in order to assist in reducing pain and posture    Time 8   Period Weeks   Status Partially Met     PT LONG TERM GOAL #4   Title Patient to demonstrate ability to perform all functional work related tasks with correct form and biomechanics in order 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 Patient appears to have made good progress with skilled PT services, showing improved cervical ROM, shoulder ROM, functional shoulder strength, and in general reduced pain levels; however, she does continue to demonstrate poor postural awareness, poor general proprioception as evidenced by difficulty with some exercises, and ongoing pain and weakness general L UE with reduced L UE grip strength noted. Of note, patient muscle coordination and proprioception does remain very poor despite approximately 8 weeks of skilled PT intervention/focus on this. At this time patient appears to have reached maximal benefit with skilled PT services and is being discharged to a home program with exercises she is able to perform independently; she was also given referral information for local massage therapists to continue addressing soft tissue limitations. DC today.    Rehab Potential Good   PT Next Visit Plan DC today    Consulted and Agree with Plan of Care Patient      Patient will benefit from skilled therapeutic intervention in order to improve the following deficits and impairments:  Improper body mechanics, Pain, Increased muscle spasms, Postural dysfunction, Decreased activity tolerance, Decreased  range of motion, Decreased strength, Hypomobility, Impaired UE functional use, Impaired flexibility  Visit Diagnosis: Cervicalgia  Abnormal posture  Cramp and spasm  Other symptoms and signs involving the musculoskeletal system       G-Codes - 2016-06-20 0854    Functional Assessment Tool Used Based on skilled clinical assessment of strength, ROM, posture, pain patterns    Functional Limitation Mobility: Walking and moving around   Mobility: Walking and Moving Around Goal Status [phone]) At least 1 percent but less than 20 percent impaired, limited or restricted   Mobility: Walking and Moving Around Discharge Status [phone]) At least 20 percent but less than 40 percent impaired, limited or restricted      Problem List Patient Active Problem List   Diagnosis Date Noted  . Myelopathy, spondylogenic, cervical 01/12/2016  . Annual physical exam 09/13/2015     PHYSICAL THERAPY DISCHARGE SUMMARY  Visits from Start of Care: 14  Current functional level related to goals / functional outcomes: Patient has progressed well with skilled PT services however seems to have reached maximal benefit from skilled PT services; she continues to demonstrate poor proprioception/general body awareness despite 8 weeks of focused skilled PT work on this area, and remaining impairments may be somewhat related to this. DC with updated HEP and referral information for local massage therapists.    Remaining deficits: Reduced cervical and shoulder ROM, pain patterns, poor posture and proprioception    Education / Equipment: DC today, updated HEP, massage therapist contact info  Plan: Patient agrees to discharge.  Patient goals were partially met. Patient is being discharged due to being pleased with the current functional level.  ?????      Deniece Ree PT, DPT [place] [address] [place], [place], [address] Phone: [phone]   Fax:   [phone]  Name: Jocelyn Jackson MRN: [id number] Date of Birth: March 12, 1951

## 2016-05-29 NOTE — Patient Instructions (Signed)
   CHIN TUCK - SUPINE  While lying on your back, tuck your chin towards your chest and press the back of your head into the table.  Maintain contact of head with the surface you are lying on the entire time. Make sure you are not looking up or down with your head/neck, keep everything on one plane.  Repeat 10-15 times, twice a day.     Supine Scapular Retraction  Pt supine with elbows bent to perpendicular to table. Pt active retracts shoulder blades together while pushing elbows into the table.  It should feel like you are squeezing your shoulder blades together, NOT like you are shrugging your shoulders.  Repeat 10-15 times, twice a day.

## 2016-05-31 ENCOUNTER — Ambulatory Visit (HOSPITAL_COMMUNITY): Payer: Medicare Other | Admitting: Physical Therapy

## 2016-07-05 ENCOUNTER — Ambulatory Visit (HOSPITAL_COMMUNITY): Payer: Medicare Other

## 2016-07-06 ENCOUNTER — Ambulatory Visit (HOSPITAL_COMMUNITY): Payer: Medicare Other

## 2016-07-06 DIAGNOSIS — I10 Essential (primary) hypertension: Secondary | ICD-10-CM | POA: Diagnosis not present

## 2016-07-06 DIAGNOSIS — M542 Cervicalgia: Secondary | ICD-10-CM | POA: Diagnosis not present

## 2016-07-11 DIAGNOSIS — I10 Essential (primary) hypertension: Secondary | ICD-10-CM | POA: Diagnosis not present

## 2016-07-11 DIAGNOSIS — Z79899 Other long term (current) drug therapy: Secondary | ICD-10-CM | POA: Diagnosis not present

## 2016-07-11 DIAGNOSIS — E119 Type 2 diabetes mellitus without complications: Secondary | ICD-10-CM | POA: Diagnosis not present

## 2016-07-20 ENCOUNTER — Ambulatory Visit (HOSPITAL_COMMUNITY)
Admission: RE | Admit: 2016-07-20 | Discharge: 2016-07-20 | Disposition: A | Discharge status: Home / Self Care | Payer: Medicare Other | Source: Ambulatory Visit | Attending: Internal Medicine | Admitting: Internal Medicine

## 2016-07-20 DIAGNOSIS — Z1231 Encounter for screening mammogram for malignant neoplasm of breast: Secondary | ICD-10-CM | POA: Diagnosis not present

## 2016-07-24 DIAGNOSIS — Z0001 Encounter for general adult medical examination with abnormal findings: Secondary | ICD-10-CM | POA: Diagnosis not present

## 2016-07-24 DIAGNOSIS — I10 Essential (primary) hypertension: Secondary | ICD-10-CM | POA: Diagnosis not present

## 2016-07-24 DIAGNOSIS — Z6826 Body mass index (BMI) 26.0-26.9, adult: Secondary | ICD-10-CM | POA: Diagnosis not present

## 2016-07-24 DIAGNOSIS — E119 Type 2 diabetes mellitus without complications: Secondary | ICD-10-CM | POA: Diagnosis not present

## 2016-11-17 DIAGNOSIS — E119 Type 2 diabetes mellitus without complications: Secondary | ICD-10-CM | POA: Diagnosis not present

## 2016-11-24 DIAGNOSIS — Z23 Encounter for immunization: Secondary | ICD-10-CM | POA: Diagnosis not present

## 2016-11-24 DIAGNOSIS — Z6827 Body mass index (BMI) 27.0-27.9, adult: Secondary | ICD-10-CM | POA: Diagnosis not present

## 2016-11-24 DIAGNOSIS — I10 Essential (primary) hypertension: Secondary | ICD-10-CM | POA: Diagnosis not present

## 2016-11-24 DIAGNOSIS — E119 Type 2 diabetes mellitus without complications: Secondary | ICD-10-CM | POA: Diagnosis not present

## 2017-03-29 DIAGNOSIS — E119 Type 2 diabetes mellitus without complications: Secondary | ICD-10-CM | POA: Diagnosis not present

## 2017-04-05 DIAGNOSIS — I10 Essential (primary) hypertension: Secondary | ICD-10-CM | POA: Diagnosis not present

## 2017-04-05 DIAGNOSIS — E119 Type 2 diabetes mellitus without complications: Secondary | ICD-10-CM | POA: Diagnosis not present

## 2017-04-05 DIAGNOSIS — M5010 Cervical disc disorder with radiculopathy, unspecified cervical region: Secondary | ICD-10-CM | POA: Diagnosis not present

## 2017-05-08 DIAGNOSIS — Z23 Encounter for immunization: Secondary | ICD-10-CM | POA: Diagnosis not present

## 2017-06-11 ENCOUNTER — Other Ambulatory Visit (HOSPITAL_COMMUNITY): Payer: Self-pay | Admitting: Internal Medicine

## 2017-06-11 DIAGNOSIS — Z1231 Encounter for screening mammogram for malignant neoplasm of breast: Secondary | ICD-10-CM

## 2017-07-18 DIAGNOSIS — I10 Essential (primary) hypertension: Secondary | ICD-10-CM | POA: Diagnosis not present

## 2017-07-18 DIAGNOSIS — E119 Type 2 diabetes mellitus without complications: Secondary | ICD-10-CM | POA: Diagnosis not present

## 2017-07-18 DIAGNOSIS — Z79899 Other long term (current) drug therapy: Secondary | ICD-10-CM | POA: Diagnosis not present

## 2017-07-23 ENCOUNTER — Ambulatory Visit (HOSPITAL_COMMUNITY): Payer: Medicare Other

## 2017-07-26 DIAGNOSIS — E119 Type 2 diabetes mellitus without complications: Secondary | ICD-10-CM | POA: Diagnosis not present

## 2017-07-26 DIAGNOSIS — Z6827 Body mass index (BMI) 27.0-27.9, adult: Secondary | ICD-10-CM | POA: Diagnosis not present

## 2017-07-26 DIAGNOSIS — Z0001 Encounter for general adult medical examination with abnormal findings: Secondary | ICD-10-CM | POA: Diagnosis not present

## 2017-07-26 DIAGNOSIS — I10 Essential (primary) hypertension: Secondary | ICD-10-CM | POA: Diagnosis not present

## 2017-08-01 ENCOUNTER — Ambulatory Visit (HOSPITAL_COMMUNITY)
Admission: RE | Admit: 2017-08-01 | Discharge: 2017-08-01 | Disposition: A | Discharge status: Home / Self Care | Payer: Medicare Other | Source: Ambulatory Visit | Attending: Internal Medicine | Admitting: Internal Medicine

## 2017-08-01 DIAGNOSIS — Z1231 Encounter for screening mammogram for malignant neoplasm of breast: Secondary | ICD-10-CM | POA: Diagnosis not present

## 2017-09-07 DIAGNOSIS — M545 Low back pain: Secondary | ICD-10-CM | POA: Diagnosis not present

## 2017-09-17 DIAGNOSIS — R112 Nausea with vomiting, unspecified: Secondary | ICD-10-CM | POA: Diagnosis not present

## 2017-11-22 DIAGNOSIS — Z79899 Other long term (current) drug therapy: Secondary | ICD-10-CM | POA: Diagnosis not present

## 2017-11-22 DIAGNOSIS — E119 Type 2 diabetes mellitus without complications: Secondary | ICD-10-CM | POA: Diagnosis not present

## 2017-11-29 DIAGNOSIS — R030 Elevated blood-pressure reading, without diagnosis of hypertension: Secondary | ICD-10-CM | POA: Diagnosis not present

## 2017-11-29 DIAGNOSIS — E119 Type 2 diabetes mellitus without complications: Secondary | ICD-10-CM | POA: Diagnosis not present

## 2017-11-29 DIAGNOSIS — M62838 Other muscle spasm: Secondary | ICD-10-CM | POA: Diagnosis not present

## 2017-12-11 DIAGNOSIS — M25561 Pain in right knee: Secondary | ICD-10-CM | POA: Diagnosis not present

## 2018-01-08 DIAGNOSIS — R140 Abdominal distension (gaseous): Secondary | ICD-10-CM | POA: Diagnosis not present

## 2018-01-09 ENCOUNTER — Other Ambulatory Visit (HOSPITAL_COMMUNITY): Payer: Self-pay | Admitting: Internal Medicine

## 2018-01-09 DIAGNOSIS — R140 Abdominal distension (gaseous): Secondary | ICD-10-CM

## 2018-01-11 ENCOUNTER — Ambulatory Visit (HOSPITAL_COMMUNITY)
Admission: RE | Admit: 2018-01-11 | Discharge: 2018-01-11 | Disposition: A | Discharge status: Home / Self Care | Payer: Medicare Other | Source: Ambulatory Visit | Attending: Internal Medicine | Admitting: Internal Medicine

## 2018-01-11 DIAGNOSIS — R140 Abdominal distension (gaseous): Secondary | ICD-10-CM | POA: Insufficient documentation

## 2018-01-11 DIAGNOSIS — K7689 Other specified diseases of liver: Secondary | ICD-10-CM | POA: Diagnosis not present

## 2018-01-11 DIAGNOSIS — Z9049 Acquired absence of other specified parts of digestive tract: Secondary | ICD-10-CM | POA: Insufficient documentation

## 2018-03-14 DIAGNOSIS — E119 Type 2 diabetes mellitus without complications: Secondary | ICD-10-CM | POA: Diagnosis not present

## 2018-03-21 DIAGNOSIS — E119 Type 2 diabetes mellitus without complications: Secondary | ICD-10-CM | POA: Diagnosis not present

## 2018-03-21 DIAGNOSIS — I10 Essential (primary) hypertension: Secondary | ICD-10-CM | POA: Diagnosis not present

## 2018-06-24 ENCOUNTER — Other Ambulatory Visit (HOSPITAL_COMMUNITY): Payer: Self-pay | Admitting: Internal Medicine

## 2018-06-24 DIAGNOSIS — Z1231 Encounter for screening mammogram for malignant neoplasm of breast: Secondary | ICD-10-CM

## 2018-07-03 ENCOUNTER — Other Ambulatory Visit: Payer: Self-pay

## 2018-07-03 ENCOUNTER — Emergency Department (HOSPITAL_COMMUNITY)
Admission: EM | Admit: 2018-07-03 | Discharge: 2018-07-03 | Disposition: A | Discharge status: Home / Self Care | Payer: Medicare Other | Attending: Emergency Medicine | Admitting: Emergency Medicine

## 2018-07-03 ENCOUNTER — Encounter (HOSPITAL_COMMUNITY): Payer: Self-pay | Admitting: Emergency Medicine

## 2018-07-03 DIAGNOSIS — R51 Headache: Secondary | ICD-10-CM | POA: Diagnosis present

## 2018-07-03 DIAGNOSIS — E119 Type 2 diabetes mellitus without complications: Secondary | ICD-10-CM | POA: Insufficient documentation

## 2018-07-03 DIAGNOSIS — I10 Essential (primary) hypertension: Secondary | ICD-10-CM | POA: Diagnosis not present

## 2018-07-03 DIAGNOSIS — J0100 Acute maxillary sinusitis, unspecified: Secondary | ICD-10-CM | POA: Insufficient documentation

## 2018-07-03 DIAGNOSIS — Z7984 Long term (current) use of oral hypoglycemic drugs: Secondary | ICD-10-CM | POA: Diagnosis not present

## 2018-07-03 DIAGNOSIS — Z87891 Personal history of nicotine dependence: Secondary | ICD-10-CM | POA: Insufficient documentation

## 2018-07-03 MED ORDER — AMOXICILLIN-POT CLAVULANATE 875-125 MG PO TABS
1.0000 | ORAL_TABLET | Freq: Once | ORAL | Status: AC
  Administered 2018-07-03: 1 via ORAL
  Filled 2018-07-03: qty 1

## 2018-07-03 MED ORDER — DIPHENHYDRAMINE HCL 50 MG/ML IJ SOLN
25.0000 mg | Freq: Once | INTRAMUSCULAR | Status: AC
  Administered 2018-07-03: 25 mg via INTRAMUSCULAR
  Filled 2018-07-03: qty 1

## 2018-07-03 MED ORDER — AMOXICILLIN-POT CLAVULANATE 875-125 MG PO TABS: 1.0000 | ORAL_TABLET | Freq: Two times a day (BID) | ORAL | 0 refills | Status: DC

## 2018-07-03 MED ORDER — LORATADINE 10 MG PO TABS: 10.0000 mg | ORAL_TABLET | Freq: Every day | ORAL | 0 refills | Status: DC | PRN

## 2018-07-03 NOTE — ED Triage Notes (Signed)
Pt c/o sinus pressure and body aches since Sunday.

## 2018-07-03 NOTE — Discharge Instructions (Addendum)
Start taking the claritin prescription tomorrow as the diphenhydramine injection you received this evening should help with your nasal drainage tonight.  Since your congestion has not responded to the nasal spray prescribed by your primary doctor, there are not any good alternatives other than medications that would elevate your blood pressure.  The best treatment would be moist steam and/or a humidifier.  Sitting in a steamy bathroom can at least temporarily help relieve the pressure of your sinus pain.  Warm moist compresses to your face can also help with pressure.  Vicks vapor rub can also help relieve congestion.

## 2018-07-04 NOTE — ED Provider Notes (Signed)
[facility] EMERGENCY DEPARTMENT Provider Note   CSN: [id number] Arrival date & time: 07/03/18  2017     History   Chief Complaint Chief Complaint  Patient presents with  . Facial Pain    HPI Jocelyn Jackson is a 67 y.o. female with a past medical history including well-controlled diabetes, hypertension, chronic pain presenting with suspected sinus infection.  She endorses a 4-day history of escalating pain and pressure across right cheek in association with nasal congestion and thick nasal discharge.  She has had subjective fever and reports generalized body aches.  She denies ear or eye pain or drainage.  No neck pain, stiffness or dizziness.  She has tried multiple OTC medications including DayQuil, and inhaled nasal steroid given by her PCP, name unknown and has also used steam and warm compresses with only temporary relief of symptoms.  The history is provided by the patient.    Past Medical History:  Diagnosis Date  . Anxiety   . Arthritis   . Chronic back pain   . Diabetes mellitus without complication ([facility])   . Hypertension   . Lumbar radiculopathy     Patient Active Problem List   Diagnosis Date Noted  . Myelopathy, spondylogenic, cervical 01/12/2016  . Annual physical exam 09/13/2015    Past Surgical History:  Procedure Laterality Date  . ABDOMINAL HYSTERECTOMY     cervix removed  . ANTERIOR CERVICAL DECOMP/DISCECTOMY FUSION N/A 01/12/2016   Procedure: CERVICAL THREE-FOUR. CERVICAL FOUR-FIVE, CERVICAL FIVE-SIX ANTERIOR CERVICAL DISCECTOMY/FUSION;  Surgeon: Donalee Citrin, MD;  Location: [facility]UR[facility];  Service: Neurosurgery;  Laterality: N/A;  . CARPAL TUNNEL RELEASE  2012   [place]  . COLONOSCOPY N/A 05/06/2015   Procedure: COLONOSCOPY;  Surgeon: Malissa Hippo, MD;  [facility]: [facility];  Service: Endoscopy;  Laterality: N/A;  830     OB History   None      Home Medications    Prior to Admission medications   Medication Sig Start Date End Date Taking?  Authorizing Provider  amLODipine (NORVASC) 10 MG tablet Take 10 mg by mouth daily.    [provider]  amoxicillin-clavulanate (AUGMENTIN) 875-125 MG tablet Take 1 tablet by mouth every 12 (twelve) hours. 07/03/18   Burgess Amor, PA-C  atorvastatin (LIPITOR) 10 MG tablet Take 10 mg by mouth daily. 10/27/15   [provider]  cyclobenzaprine (FLEXERIL) 10 MG tablet Take 1 tablet (10 mg total) by mouth 3 (three) times daily as needed for muscle spasms. 01/13/16   Donalee Citrin, MD  ibuprofen (ADVIL,MOTRIN) 800 MG tablet Take 800 mg by mouth 3 (three) times daily as needed. pain 07/25/13   [provider]  loratadine (CLARITIN) 10 MG tablet Take 1 tablet (10 mg total) by mouth daily as needed (nasal drainage). 07/03/18   Burgess Amor, PA-C  losartan (COZAAR) 100 MG tablet Take 100 mg by mouth daily. 11/24/15   [provider]  metFORMIN (GLUCOPHAGE) 500 MG tablet Take 500 mg by mouth daily with breakfast.  09/10/15   [provider]  naproxen (NAPROSYN) 500 MG tablet Take 1 tablet (500 mg total) by mouth 2 (two) times daily. 10/15/15   Burgess Amor, PA-C  oxyCODONE-acetaminophen (PERCOCET/ROXICET) 5-325 MG tablet Take 1-2 tablets by mouth every 4 (four) hours as needed for moderate pain. 01/13/16   Donalee Citrin, MD  potassium chloride SA (KLOR-CON M20) 20 MEQ tablet Take 20 mEq by mouth daily. 10/19/14   [provider]    Family History Family History  Problem Relation Age of Onset  . Heart disease Mother   . Hypertension Mother   . Diabetes Mother   . Heart disease Father   . Hypertension Father   . Diabetes Father   . Cancer Sister   . Hypertension Brother   . Hypertension Sister   . Hypertension Sister     Social History Social History   Tobacco Use  . Smoking status: Former Smoker    Packs/day: 0.25    Years: 0.50    Pack years: 0.12    Types: Cigarettes  . Smokeless tobacco: Never Used  Substance Use Topics  . Alcohol use: No  . Drug  use: No     Allergies   Chlorhexidine   Review of Systems Review of Systems  Constitutional: Positive for fever. Negative for chills.  HENT: Positive for congestion, ear discharge, sinus pressure and sinus pain. Negative for ear pain, rhinorrhea, sore throat, trouble swallowing and voice change.   Eyes: Negative for discharge.  Respiratory: Negative for cough, shortness of breath, wheezing and stridor.   Cardiovascular: Negative for chest pain.  Gastrointestinal: Negative for abdominal pain.  Genitourinary: Negative.      Physical Exam Updated Vital Signs BP (!) 175/84 (BP Location: Right Arm)   Pulse 87   Temp 98.4 F (36.9 C) (Oral)   Resp 16   Ht 5\' 4"  (1.626 m)   Wt 63.5 kg   SpO2 97%   BMI 24.03 kg/m   Physical Exam  Constitutional: She is oriented to person, place, and time. She appears well-developed and well-nourished.  HENT:  Head: Normocephalic and atraumatic.  Right Ear: Tympanic membrane and ear canal normal.  Left Ear: Tympanic membrane and ear canal normal.  Nose: Mucosal edema and rhinorrhea present. Right sinus exhibits maxillary sinus tenderness.  Mouth/Throat: Uvula is midline, oropharynx is clear and moist and mucous membranes are normal. No oropharyngeal exudate, posterior oropharyngeal edema, posterior oropharyngeal erythema or tonsillar abscesses.  Eyes: Conjunctivae are normal.  Cardiovascular: Normal rate and normal heart sounds.  Pulmonary/Chest: Effort normal. No respiratory distress. She has no wheezes. She has no rales.  Musculoskeletal: Normal range of motion.  Neurological: She is alert and oriented to person, place, and time. She has normal strength. No cranial nerve deficit or sensory deficit. Gait normal.  Skin: Skin is warm and dry. No rash noted.  Psychiatric: She has a normal mood and affect.     ED Treatments / Results  Labs (all labs ordered are listed, but only abnormal results are displayed) Labs Reviewed - No data to  display  EKG None  Radiology No results found.  Procedures Procedures (including critical care time)  Medications Ordered in ED Medications  diphenhydrAMINE (BENADRYL) injection 25 mg (25 mg Intramuscular Given 07/03/18 2104)  amoxicillin-clavulanate (AUGMENTIN) 875-125 MG per tablet 1 tablet (1 tablet Oral Given 07/03/18 2104)     Initial Impression / Assessment and Plan / ED Course  I have reviewed the triage vital signs and the nursing notes.  Pertinent labs & imaging results that were available during my care of the patient were reviewed by me and considered in my medical decision making (see chart for details).     Patient with acute maxillary sinusitis.  She was started on Augmentin.  Claritin also prescribed.  Also discussed Coricidin brand products for symptom relief as these will not interfere with blood pressure.  Discussed continued steam therapy, could try Vicks vapor rub or inhaler sticks to help with nasal congestion.  Plan follow-up with PCP if symptoms persist or worsen.  Final Clinical Impressions(s) / ED Diagnoses   Final diagnoses:  Acute non-recurrent maxillary sinusitis    ED Discharge Orders         Ordered    loratadine (CLARITIN) 10 MG tablet  Daily PRN     07/03/18 2055    amoxicillin-clavulanate (AUGMENTIN) 875-125 MG tablet  Every 12 hours     07/03/18 2055           Burgess Amor, PA-C 07/04/18 0044    Sabas Sous, MD 07/05/18 1500

## 2018-07-18 DIAGNOSIS — E119 Type 2 diabetes mellitus without complications: Secondary | ICD-10-CM | POA: Diagnosis not present

## 2018-07-18 DIAGNOSIS — I10 Essential (primary) hypertension: Secondary | ICD-10-CM | POA: Diagnosis not present

## 2018-07-18 DIAGNOSIS — Z79899 Other long term (current) drug therapy: Secondary | ICD-10-CM | POA: Diagnosis not present

## 2018-07-26 DIAGNOSIS — Z6827 Body mass index (BMI) 27.0-27.9, adult: Secondary | ICD-10-CM | POA: Diagnosis not present

## 2018-07-26 DIAGNOSIS — E1159 Type 2 diabetes mellitus with other circulatory complications: Secondary | ICD-10-CM | POA: Diagnosis not present

## 2018-07-26 DIAGNOSIS — Z0001 Encounter for general adult medical examination with abnormal findings: Secondary | ICD-10-CM | POA: Diagnosis not present

## 2018-07-26 DIAGNOSIS — I10 Essential (primary) hypertension: Secondary | ICD-10-CM | POA: Diagnosis not present

## 2018-08-05 ENCOUNTER — Ambulatory Visit (HOSPITAL_COMMUNITY)
Admission: RE | Admit: 2018-08-05 | Discharge: 2018-08-05 | Disposition: A | Discharge status: Home / Self Care | Payer: Medicare Other | Source: Ambulatory Visit | Attending: Internal Medicine | Admitting: Internal Medicine

## 2018-08-05 DIAGNOSIS — Z1231 Encounter for screening mammogram for malignant neoplasm of breast: Secondary | ICD-10-CM | POA: Insufficient documentation

## 2018-08-09 ENCOUNTER — Other Ambulatory Visit (HOSPITAL_COMMUNITY): Payer: Self-pay | Admitting: Internal Medicine

## 2018-08-09 DIAGNOSIS — R928 Other abnormal and inconclusive findings on diagnostic imaging of breast: Secondary | ICD-10-CM

## 2018-08-13 ENCOUNTER — Encounter (HOSPITAL_COMMUNITY): Payer: Self-pay

## 2018-08-13 ENCOUNTER — Ambulatory Visit (HOSPITAL_COMMUNITY)
Admission: RE | Admit: 2018-08-13 | Discharge: 2018-08-13 | Disposition: A | Discharge status: Home / Self Care | Payer: Medicare Other | Source: Ambulatory Visit | Attending: Internal Medicine | Admitting: Internal Medicine

## 2018-08-13 ENCOUNTER — Ambulatory Visit (HOSPITAL_COMMUNITY): Payer: Medicare Other

## 2018-08-13 DIAGNOSIS — R928 Other abnormal and inconclusive findings on diagnostic imaging of breast: Secondary | ICD-10-CM

## 2018-08-13 DIAGNOSIS — R922 Inconclusive mammogram: Secondary | ICD-10-CM | POA: Diagnosis not present

## 2018-08-13 DIAGNOSIS — N6489 Other specified disorders of breast: Secondary | ICD-10-CM | POA: Diagnosis not present

## 2019-04-10 DIAGNOSIS — J019 Acute sinusitis, unspecified: Secondary | ICD-10-CM | POA: Diagnosis not present

## 2019-04-11 ENCOUNTER — Other Ambulatory Visit: Payer: Self-pay | Admitting: *Deleted

## 2019-04-11 DIAGNOSIS — R6889 Other general symptoms and signs: Secondary | ICD-10-CM | POA: Diagnosis not present

## 2019-04-11 DIAGNOSIS — Z20822 Contact with and (suspected) exposure to covid-19: Secondary | ICD-10-CM

## 2019-04-13 LAB — NOVEL CORONAVIRUS, NAA: SARS-CoV-2, NAA: NOT DETECTED

## 2019-05-06 DIAGNOSIS — Z23 Encounter for immunization: Secondary | ICD-10-CM | POA: Diagnosis not present

## 2019-07-07 ENCOUNTER — Other Ambulatory Visit (HOSPITAL_COMMUNITY): Payer: Self-pay | Admitting: Internal Medicine

## 2019-07-07 DIAGNOSIS — Z1231 Encounter for screening mammogram for malignant neoplasm of breast: Secondary | ICD-10-CM

## 2019-07-15 ENCOUNTER — Other Ambulatory Visit: Payer: Self-pay | Admitting: *Deleted

## 2019-07-15 DIAGNOSIS — Z20822 Contact with and (suspected) exposure to covid-19: Secondary | ICD-10-CM

## 2019-07-17 LAB — NOVEL CORONAVIRUS, NAA: SARS-CoV-2, NAA: NOT DETECTED

## 2019-08-05 DIAGNOSIS — I10 Essential (primary) hypertension: Secondary | ICD-10-CM | POA: Diagnosis not present

## 2019-08-05 DIAGNOSIS — E118 Type 2 diabetes mellitus with unspecified complications: Secondary | ICD-10-CM | POA: Diagnosis not present

## 2019-08-05 DIAGNOSIS — Z79899 Other long term (current) drug therapy: Secondary | ICD-10-CM | POA: Diagnosis not present

## 2019-08-05 DIAGNOSIS — E1159 Type 2 diabetes mellitus with other circulatory complications: Secondary | ICD-10-CM | POA: Diagnosis not present

## 2019-08-12 DIAGNOSIS — E1159 Type 2 diabetes mellitus with other circulatory complications: Secondary | ICD-10-CM | POA: Diagnosis not present

## 2019-08-12 DIAGNOSIS — I10 Essential (primary) hypertension: Secondary | ICD-10-CM | POA: Diagnosis not present

## 2019-08-12 DIAGNOSIS — E785 Hyperlipidemia, unspecified: Secondary | ICD-10-CM | POA: Diagnosis not present

## 2019-08-13 ENCOUNTER — Other Ambulatory Visit (HOSPITAL_COMMUNITY): Payer: Self-pay | Admitting: Internal Medicine

## 2019-08-13 DIAGNOSIS — Z780 Asymptomatic menopausal state: Secondary | ICD-10-CM

## 2019-08-22 ENCOUNTER — Ambulatory Visit (HOSPITAL_COMMUNITY)
Admission: RE | Admit: 2019-08-22 | Discharge: 2019-08-22 | Disposition: A | Discharge status: Home / Self Care | Payer: Medicare Other | Source: Ambulatory Visit | Attending: Internal Medicine | Admitting: Internal Medicine

## 2019-08-22 ENCOUNTER — Other Ambulatory Visit: Payer: Self-pay

## 2019-08-22 DIAGNOSIS — Z1231 Encounter for screening mammogram for malignant neoplasm of breast: Secondary | ICD-10-CM | POA: Diagnosis not present

## 2019-08-22 DIAGNOSIS — Z780 Asymptomatic menopausal state: Secondary | ICD-10-CM

## 2019-08-22 DIAGNOSIS — M85852 Other specified disorders of bone density and structure, left thigh: Secondary | ICD-10-CM | POA: Diagnosis not present

## 2019-11-27 IMAGING — MG DIGITAL DIAGNOSTIC BILATERAL MAMMOGRAM WITH TOMO AND CAD
6 of 12 series · 6 of 36 positions shown · non-contrast
Comparison: 08/05/2018

CLINICAL DATA: The patient returns after screening study for
evaluation of possible distortion in both breasts.

EXAM:
DIGITAL DIAGNOSTIC BILATERAL MAMMOGRAM WITH CAD AND TOMO
ULTRASOUND RIGHT BREAST

[L MLO synth-2D]
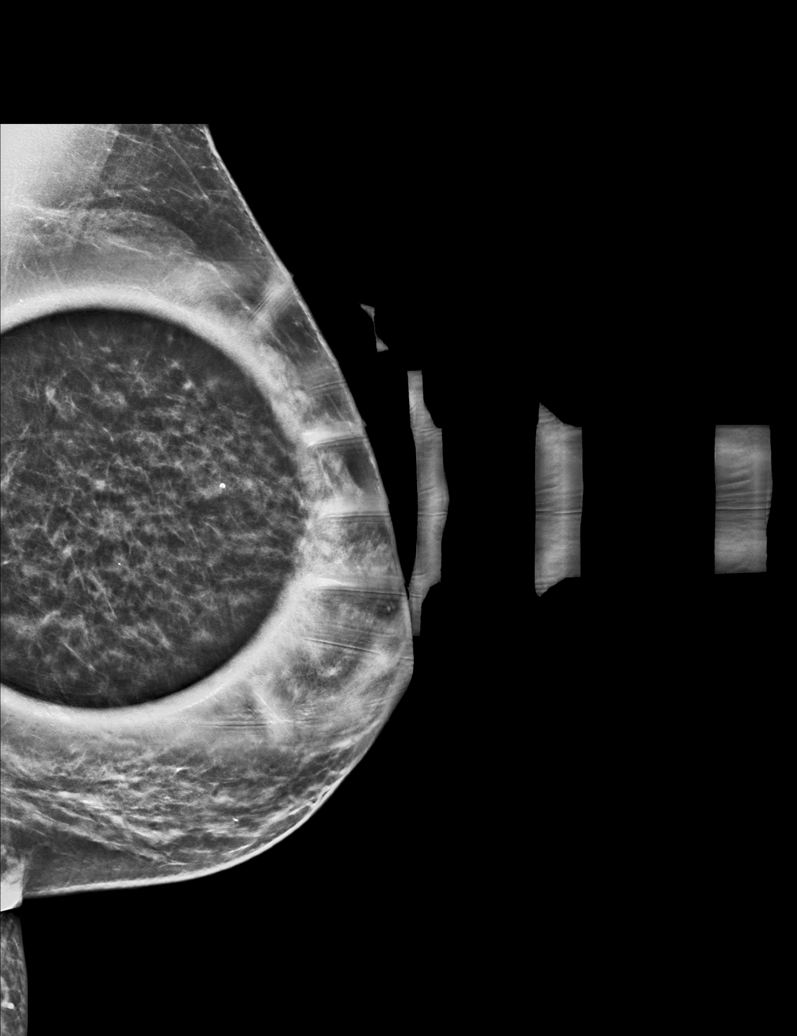

[R MLO synth-2D (1 of 2)]
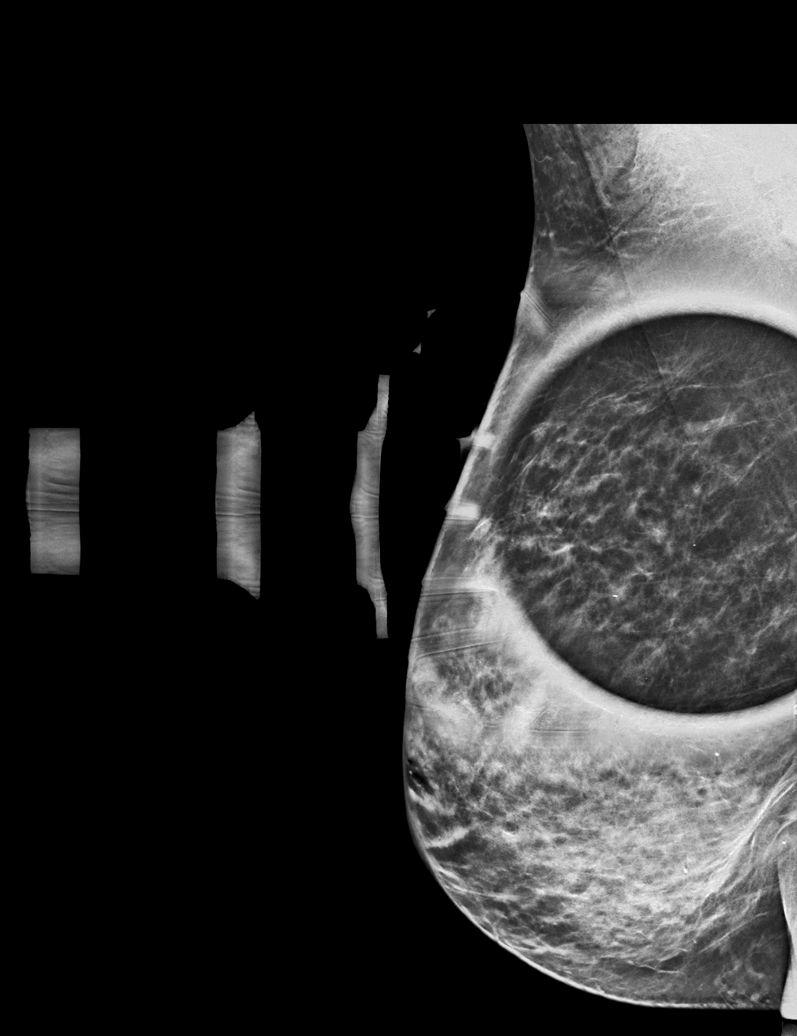

[L CC synth-2D (1 of 2)]
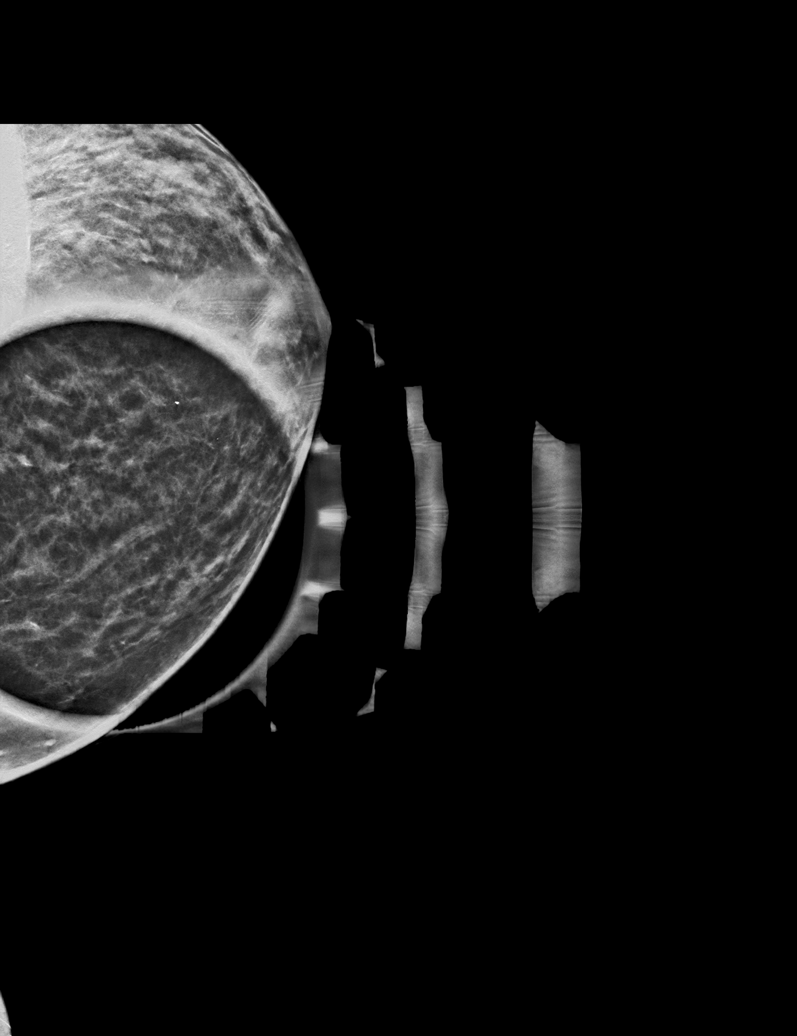

[R CC synth-2D]
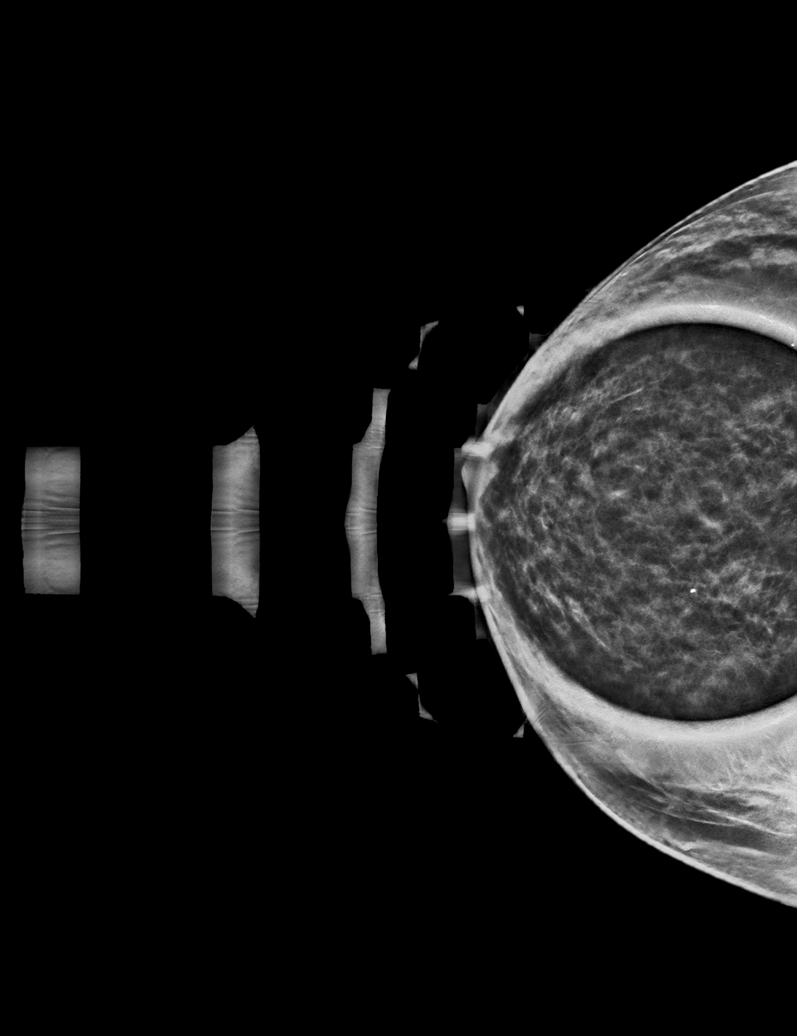

[R MLO synth-2D (2 of 2)]
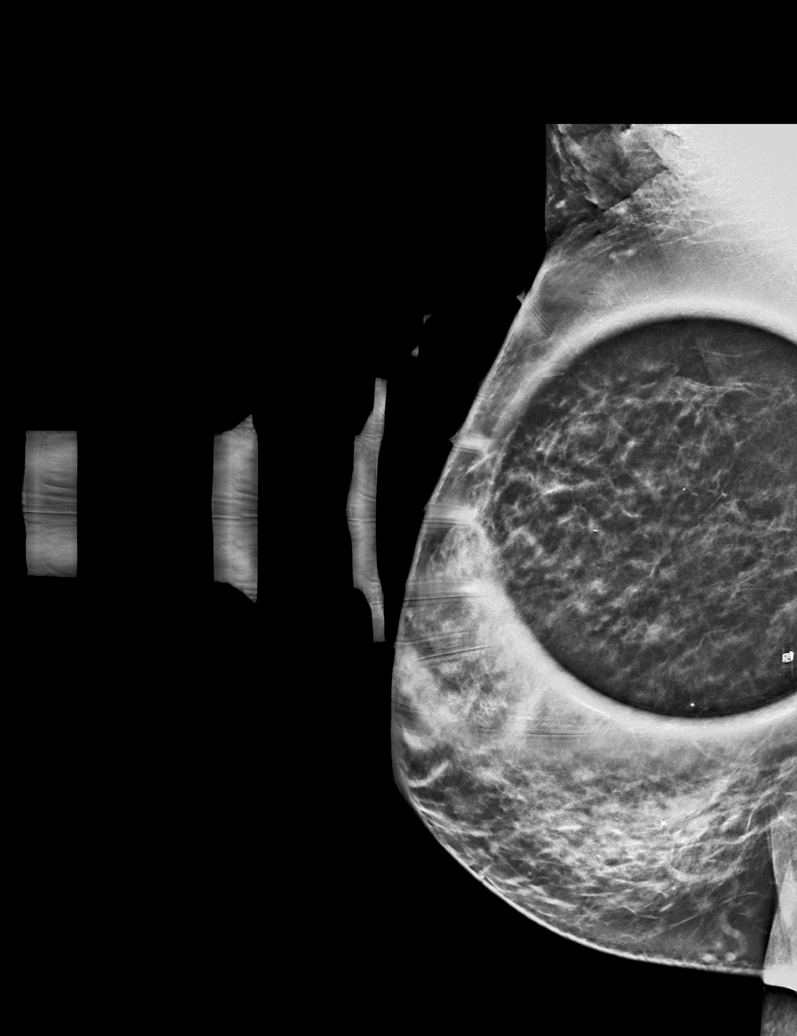

[L CC synth-2D (2 of 2)]
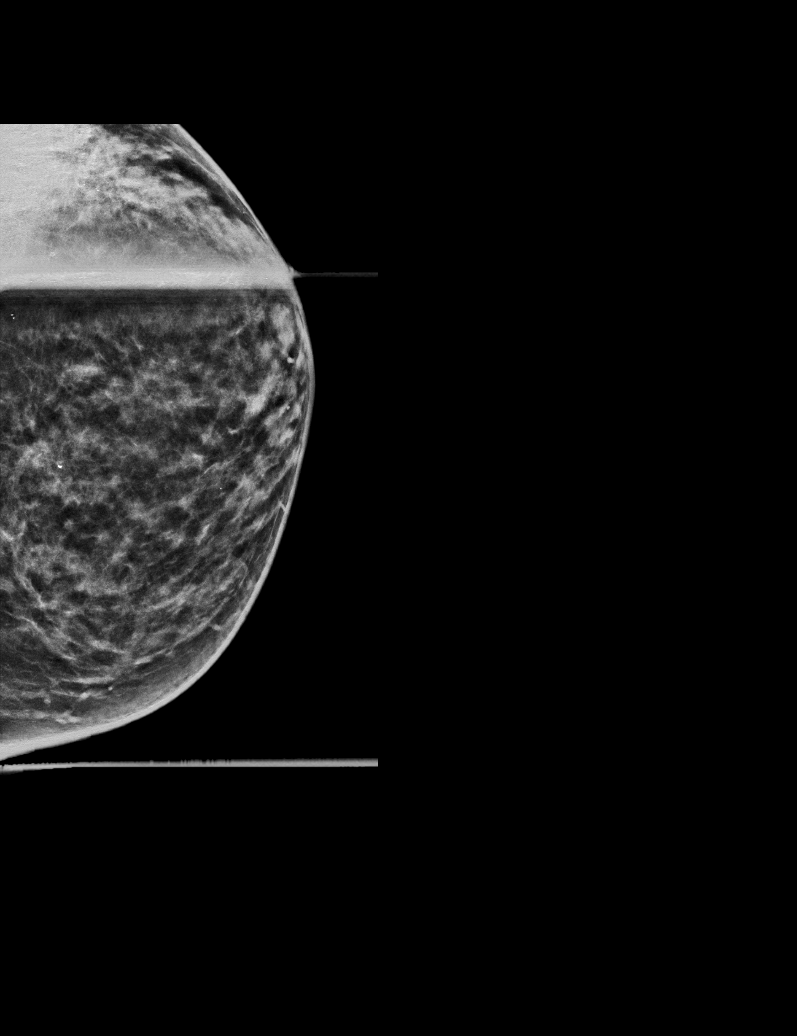

[6 of 36 positions shown; findings below may reference images not displayed]

ACR Breast Density Category c: The breast tissue is heterogeneously
dense, which may obscure small masses.
FINDINGS: Right breast:

Mammogram: Additional 2-D and 3-D images are performed. These view
show no persistent asymmetry, distortion, or mass in the UPPER
portion of the RIGHT breast. Mammographic images were processed with
CAD.

Physical Exam: I palpate no abnormality in the UPPER aspect of the
RIGHT breast.

Ultrasound: Targeted ultrasound is performed, showing extremely
dense fibroglandular tissue throughout the UPPER portion of the
RIGHT breast. No discrete mass identified.

Left breast:

Mammogram: Additional 2-D and 3-D images are performed. No
persistent distortion or asymmetry identified in the UPPER central
portion of the LEFT breast. Mammographic images were processed with
CAD.
IMPRESSION: No mammographic or ultrasound evidence for malignancy.

RECOMMENDATION:
Screening mammogram in one year.(Code:T6-C-AI1)

I have discussed the findings and recommendations with the patient.
Results were also provided in writing at the conclusion of the
visit. If applicable, a reminder letter will be sent to the patient
regarding the next appointment.

BI-RADS CATEGORY  1: Negative.

## 2019-12-11 DIAGNOSIS — E1159 Type 2 diabetes mellitus with other circulatory complications: Secondary | ICD-10-CM | POA: Diagnosis not present

## 2019-12-11 DIAGNOSIS — I10 Essential (primary) hypertension: Secondary | ICD-10-CM | POA: Diagnosis not present

## 2019-12-11 DIAGNOSIS — Z79899 Other long term (current) drug therapy: Secondary | ICD-10-CM | POA: Diagnosis not present

## 2019-12-11 DIAGNOSIS — E785 Hyperlipidemia, unspecified: Secondary | ICD-10-CM | POA: Diagnosis not present

## 2019-12-18 DIAGNOSIS — I10 Essential (primary) hypertension: Secondary | ICD-10-CM | POA: Diagnosis not present

## 2019-12-18 DIAGNOSIS — Z6826 Body mass index (BMI) 26.0-26.9, adult: Secondary | ICD-10-CM | POA: Diagnosis not present

## 2019-12-18 DIAGNOSIS — E876 Hypokalemia: Secondary | ICD-10-CM | POA: Diagnosis not present

## 2019-12-18 DIAGNOSIS — E1139 Type 2 diabetes mellitus with other diabetic ophthalmic complication: Secondary | ICD-10-CM | POA: Diagnosis not present

## 2020-04-14 DIAGNOSIS — E1159 Type 2 diabetes mellitus with other circulatory complications: Secondary | ICD-10-CM | POA: Diagnosis not present

## 2020-04-14 DIAGNOSIS — I10 Essential (primary) hypertension: Secondary | ICD-10-CM | POA: Diagnosis not present

## 2020-04-14 DIAGNOSIS — Z79899 Other long term (current) drug therapy: Secondary | ICD-10-CM | POA: Diagnosis not present

## 2020-04-14 DIAGNOSIS — E785 Hyperlipidemia, unspecified: Secondary | ICD-10-CM | POA: Diagnosis not present

## 2020-04-19 DIAGNOSIS — I10 Essential (primary) hypertension: Secondary | ICD-10-CM | POA: Diagnosis not present

## 2020-04-19 DIAGNOSIS — R7309 Other abnormal glucose: Secondary | ICD-10-CM | POA: Diagnosis not present

## 2020-04-19 DIAGNOSIS — E1139 Type 2 diabetes mellitus with other diabetic ophthalmic complication: Secondary | ICD-10-CM | POA: Diagnosis not present

## 2020-04-19 DIAGNOSIS — N289 Disorder of kidney and ureter, unspecified: Secondary | ICD-10-CM | POA: Diagnosis not present

## 2020-05-20 DIAGNOSIS — E876 Hypokalemia: Secondary | ICD-10-CM | POA: Diagnosis not present

## 2020-05-20 DIAGNOSIS — N181 Chronic kidney disease, stage 1: Secondary | ICD-10-CM | POA: Diagnosis not present

## 2020-05-20 DIAGNOSIS — I10 Essential (primary) hypertension: Secondary | ICD-10-CM | POA: Diagnosis not present

## 2020-05-20 DIAGNOSIS — E1139 Type 2 diabetes mellitus with other diabetic ophthalmic complication: Secondary | ICD-10-CM | POA: Diagnosis not present

## 2020-06-08 DIAGNOSIS — Z23 Encounter for immunization: Secondary | ICD-10-CM | POA: Diagnosis not present

## 2020-07-13 DIAGNOSIS — M6530 Trigger finger, unspecified finger: Secondary | ICD-10-CM | POA: Diagnosis not present

## 2020-07-13 DIAGNOSIS — M19011 Primary osteoarthritis, right shoulder: Secondary | ICD-10-CM | POA: Diagnosis not present

## 2020-07-14 ENCOUNTER — Other Ambulatory Visit (HOSPITAL_COMMUNITY): Payer: Self-pay | Admitting: Internal Medicine

## 2020-07-14 DIAGNOSIS — Z1231 Encounter for screening mammogram for malignant neoplasm of breast: Secondary | ICD-10-CM

## 2020-07-23 DIAGNOSIS — M19041 Primary osteoarthritis, right hand: Secondary | ICD-10-CM | POA: Diagnosis not present

## 2020-07-23 DIAGNOSIS — M65331 Trigger finger, right middle finger: Secondary | ICD-10-CM | POA: Diagnosis not present

## 2020-07-23 DIAGNOSIS — M1811 Unilateral primary osteoarthritis of first carpometacarpal joint, right hand: Secondary | ICD-10-CM | POA: Diagnosis not present

## 2020-07-23 DIAGNOSIS — M25831 Other specified joint disorders, right wrist: Secondary | ICD-10-CM | POA: Diagnosis not present

## 2020-08-10 DIAGNOSIS — Z79899 Other long term (current) drug therapy: Secondary | ICD-10-CM | POA: Diagnosis not present

## 2020-08-10 DIAGNOSIS — E113291 Type 2 diabetes mellitus with mild nonproliferative diabetic retinopathy without macular edema, right eye: Secondary | ICD-10-CM | POA: Diagnosis not present

## 2020-08-10 DIAGNOSIS — I10 Essential (primary) hypertension: Secondary | ICD-10-CM | POA: Diagnosis not present

## 2020-08-10 DIAGNOSIS — E876 Hypokalemia: Secondary | ICD-10-CM | POA: Diagnosis not present

## 2020-08-13 DIAGNOSIS — E785 Hyperlipidemia, unspecified: Secondary | ICD-10-CM | POA: Diagnosis not present

## 2020-08-13 DIAGNOSIS — E1139 Type 2 diabetes mellitus with other diabetic ophthalmic complication: Secondary | ICD-10-CM | POA: Diagnosis not present

## 2020-08-13 DIAGNOSIS — Z0001 Encounter for general adult medical examination with abnormal findings: Secondary | ICD-10-CM | POA: Diagnosis not present

## 2020-08-13 DIAGNOSIS — I10 Essential (primary) hypertension: Secondary | ICD-10-CM | POA: Diagnosis not present

## 2020-08-26 ENCOUNTER — Ambulatory Visit (HOSPITAL_COMMUNITY): Payer: Medicare Other

## 2020-09-03 DIAGNOSIS — M65331 Trigger finger, right middle finger: Secondary | ICD-10-CM | POA: Diagnosis not present

## 2020-09-06 ENCOUNTER — Ambulatory Visit (HOSPITAL_COMMUNITY)
Admission: RE | Admit: 2020-09-06 | Discharge: 2020-09-06 | Disposition: A | Discharge status: Home / Self Care | Payer: Medicare Other | Source: Ambulatory Visit | Attending: Internal Medicine | Admitting: Internal Medicine

## 2020-09-06 ENCOUNTER — Other Ambulatory Visit: Payer: Self-pay

## 2020-09-06 DIAGNOSIS — Z1231 Encounter for screening mammogram for malignant neoplasm of breast: Secondary | ICD-10-CM | POA: Insufficient documentation

## 2020-10-04 DIAGNOSIS — E113299 Type 2 diabetes mellitus with mild nonproliferative diabetic retinopathy without macular edema, unspecified eye: Secondary | ICD-10-CM | POA: Diagnosis not present

## 2020-10-04 DIAGNOSIS — I10 Essential (primary) hypertension: Secondary | ICD-10-CM | POA: Diagnosis not present

## 2020-10-22 DIAGNOSIS — M65331 Trigger finger, right middle finger: Secondary | ICD-10-CM | POA: Diagnosis not present

## 2020-11-04 DIAGNOSIS — I10 Essential (primary) hypertension: Secondary | ICD-10-CM | POA: Diagnosis not present

## 2020-11-04 DIAGNOSIS — E113299 Type 2 diabetes mellitus with mild nonproliferative diabetic retinopathy without macular edema, unspecified eye: Secondary | ICD-10-CM | POA: Diagnosis not present

## 2020-11-11 ENCOUNTER — Other Ambulatory Visit: Payer: Self-pay | Admitting: Internal Medicine

## 2020-11-11 DIAGNOSIS — R140 Abdominal distension (gaseous): Secondary | ICD-10-CM | POA: Diagnosis not present

## 2020-11-11 DIAGNOSIS — R1084 Generalized abdominal pain: Secondary | ICD-10-CM

## 2020-11-17 ENCOUNTER — Ambulatory Visit (HOSPITAL_COMMUNITY)
Admission: RE | Admit: 2020-11-17 | Discharge: 2020-11-17 | Disposition: A | Discharge status: Home / Self Care | Payer: Medicare Other | Source: Ambulatory Visit | Attending: Internal Medicine | Admitting: Internal Medicine

## 2020-11-17 DIAGNOSIS — R1084 Generalized abdominal pain: Secondary | ICD-10-CM | POA: Diagnosis not present

## 2020-11-17 DIAGNOSIS — R140 Abdominal distension (gaseous): Secondary | ICD-10-CM

## 2020-11-17 DIAGNOSIS — R109 Unspecified abdominal pain: Secondary | ICD-10-CM | POA: Diagnosis not present

## 2020-12-04 DIAGNOSIS — E113299 Type 2 diabetes mellitus with mild nonproliferative diabetic retinopathy without macular edema, unspecified eye: Secondary | ICD-10-CM | POA: Diagnosis not present

## 2020-12-04 DIAGNOSIS — I10 Essential (primary) hypertension: Secondary | ICD-10-CM | POA: Diagnosis not present

## 2020-12-06 DIAGNOSIS — E113291 Type 2 diabetes mellitus with mild nonproliferative diabetic retinopathy without macular edema, right eye: Secondary | ICD-10-CM | POA: Diagnosis not present

## 2020-12-13 DIAGNOSIS — I10 Essential (primary) hypertension: Secondary | ICD-10-CM | POA: Diagnosis not present

## 2020-12-13 DIAGNOSIS — E1139 Type 2 diabetes mellitus with other diabetic ophthalmic complication: Secondary | ICD-10-CM | POA: Diagnosis not present

## 2020-12-13 DIAGNOSIS — R7309 Other abnormal glucose: Secondary | ICD-10-CM | POA: Diagnosis not present

## 2021-03-31 DIAGNOSIS — I10 Essential (primary) hypertension: Secondary | ICD-10-CM | POA: Diagnosis not present

## 2021-03-31 DIAGNOSIS — E876 Hypokalemia: Secondary | ICD-10-CM | POA: Diagnosis not present

## 2021-04-08 DIAGNOSIS — Z79899 Other long term (current) drug therapy: Secondary | ICD-10-CM | POA: Diagnosis not present

## 2021-04-08 DIAGNOSIS — I10 Essential (primary) hypertension: Secondary | ICD-10-CM | POA: Diagnosis not present

## 2021-04-08 DIAGNOSIS — E876 Hypokalemia: Secondary | ICD-10-CM | POA: Diagnosis not present

## 2021-07-28 DIAGNOSIS — I10 Essential (primary) hypertension: Secondary | ICD-10-CM | POA: Diagnosis not present

## 2021-07-28 DIAGNOSIS — E875 Hyperkalemia: Secondary | ICD-10-CM | POA: Diagnosis not present

## 2021-07-28 DIAGNOSIS — R7309 Other abnormal glucose: Secondary | ICD-10-CM | POA: Diagnosis not present

## 2021-08-01 ENCOUNTER — Other Ambulatory Visit (HOSPITAL_COMMUNITY): Payer: Self-pay | Admitting: Internal Medicine

## 2021-08-01 DIAGNOSIS — Z1231 Encounter for screening mammogram for malignant neoplasm of breast: Secondary | ICD-10-CM

## 2021-08-04 DIAGNOSIS — I10 Essential (primary) hypertension: Secondary | ICD-10-CM | POA: Diagnosis not present

## 2021-08-04 DIAGNOSIS — E11311 Type 2 diabetes mellitus with unspecified diabetic retinopathy with macular edema: Secondary | ICD-10-CM | POA: Diagnosis not present

## 2021-08-04 DIAGNOSIS — R7309 Other abnormal glucose: Secondary | ICD-10-CM | POA: Diagnosis not present

## 2021-09-07 ENCOUNTER — Ambulatory Visit (HOSPITAL_COMMUNITY)
Admission: RE | Admit: 2021-09-07 | Discharge: 2021-09-07 | Disposition: A | Discharge status: Home / Self Care | Payer: Medicare Other | Source: Ambulatory Visit | Attending: Internal Medicine | Admitting: Internal Medicine

## 2021-09-07 ENCOUNTER — Other Ambulatory Visit: Payer: Self-pay

## 2021-09-07 DIAGNOSIS — Z1231 Encounter for screening mammogram for malignant neoplasm of breast: Secondary | ICD-10-CM | POA: Insufficient documentation

## 2021-10-13 ENCOUNTER — Other Ambulatory Visit (HOSPITAL_COMMUNITY): Payer: Self-pay | Admitting: Internal Medicine

## 2021-10-13 ENCOUNTER — Other Ambulatory Visit: Payer: Self-pay

## 2021-10-13 ENCOUNTER — Ambulatory Visit (HOSPITAL_COMMUNITY)
Admission: RE | Admit: 2021-10-13 | Discharge: 2021-10-13 | Disposition: A | Discharge status: Home / Self Care | Payer: Medicare Other | Source: Ambulatory Visit | Attending: Internal Medicine | Admitting: Internal Medicine

## 2021-10-13 DIAGNOSIS — M25511 Pain in right shoulder: Secondary | ICD-10-CM

## 2021-10-27 DIAGNOSIS — E785 Hyperlipidemia, unspecified: Secondary | ICD-10-CM | POA: Diagnosis not present

## 2021-10-27 DIAGNOSIS — E1139 Type 2 diabetes mellitus with other diabetic ophthalmic complication: Secondary | ICD-10-CM | POA: Diagnosis not present

## 2021-10-27 DIAGNOSIS — Z79899 Other long term (current) drug therapy: Secondary | ICD-10-CM | POA: Diagnosis not present

## 2021-10-27 DIAGNOSIS — I10 Essential (primary) hypertension: Secondary | ICD-10-CM | POA: Diagnosis not present

## 2021-11-02 ENCOUNTER — Ambulatory Visit: Payer: Medicare Other | Admitting: Orthopedic Surgery

## 2021-11-02 ENCOUNTER — Other Ambulatory Visit: Payer: Self-pay

## 2021-11-02 ENCOUNTER — Encounter: Payer: Self-pay | Admitting: Orthopedic Surgery

## 2021-11-02 VITALS — BP 130/81 | HR 84 | Ht 64.0 in | Wt 136.0 lb

## 2021-11-02 DIAGNOSIS — M7581 Other shoulder lesions, right shoulder: Secondary | ICD-10-CM | POA: Diagnosis not present

## 2021-11-02 NOTE — Progress Notes (Signed)
New Patient Visit ? ?Assessment: ?Jocelyn Jackson is a 71 y.o. female with the following: ?1. Right rotator cuff tendinitis ? ?Plan: ?Johny Shears has pain in the right shoulder.  Pain worsens with movement.  Movement and strength overall is good.  No acute injuries noted on x-rays.  She is not interested in surgery.  We discussed multiple treatment options, she would like to proceed with an injection.  This was completed in clinic today.  No restrictions in her activity.  Follow-up as needed. ? ? ? ?Procedure note injection - Right shoulder  ?  ?Verbal consent was obtained to inject the right shoulder, subacromial space ?Timeout was completed to confirm the site of injection.   ?The skin was prepped with alcohol and ethyl chloride was sprayed at the injection site.  ?A 21-gauge needle was used to inject 40 mg of Depo-Medrol and 1% lidocaine (3 cc) into the subacromial space of the right shoulder using a posterolateral approach.  ?There were no complications.  ?A sterile bandage was applied.  ? ? ?Follow-up: ?Return if symptoms worsen or fail to improve. ? ?Subjective: ? ?Chief Complaint  ?Patient presents with  ? Shoulder Pain  ?  Rt shoulder pain for 1 mo. NKI 8-9/10   ? ? ?History of Present Illness: ?Jocelyn Jackson is a 71 y.o. female who has been referred by Carylon Perches, MD for evaluation of right shoulder pain.  She states she had pain in the lateral aspect of the right shoulder for the past month.  No specific injury.  She notes some pain radiating distally.  She has difficulty with overhead motion.  She has tried some over-the-counter medications, with limited improvement in her symptoms.  She is not interested in a procedure. ? ? ?Review of Systems: ?No fevers or chills ?No numbness or tingling ?No chest pain ?No shortness of breath ?No bowel or bladder dysfunction ?No GI distress ?No headaches ? ? ?Medical History: ? ?Past Medical History:  ?Diagnosis Date  ? Anxiety   ? Arthritis   ? Chronic back pain   ?  Diabetes mellitus without complication (HCC)   ? Hypertension   ? Lumbar radiculopathy   ? ? ?Past Surgical History:  ?Procedure Laterality Date  ? ABDOMINAL HYSTERECTOMY    ? cervix removed  ? ANTERIOR CERVICAL DECOMP/DISCECTOMY FUSION N/A 01/12/2016  ? Procedure: CERVICAL THREE-FOUR. CERVICAL FOUR-FIVE, CERVICAL FIVE-SIX ANTERIOR CERVICAL DISCECTOMY/FUSION;  Surgeon: Donalee Citrin, MD;  Location: [facility];  Service: Neurosurgery;  Laterality: N/A;  ? CARPAL TUNNEL RELEASE  2012  ? [place]  ? COLONOSCOPY N/A 05/06/2015  ? Procedure: COLONOSCOPY;  Surgeon: Malissa Hippo, MD;  Location: [facility];  Service: Endoscopy;  Laterality: N/A;  830  ? ? ?Family History  ?Problem Relation Age of Onset  ? Heart disease Mother   ? Hypertension Mother   ? Diabetes Mother   ? Heart disease Father   ? Hypertension Father   ? Diabetes Father   ? Cancer Sister   ? Hypertension Brother   ? Hypertension Sister   ? Hypertension Sister   ? ?Social History  ? ?Tobacco Use  ? Smoking status: Former  ?  Packs/day: 0.25  ?  Years: 0.50  ?  Pack years: 0.13  ?  Types: Cigarettes  ? Smokeless tobacco: Never  ?Substance Use Topics  ? Alcohol use: No  ? Drug use: No  ? ? ?Allergies  ?Allergen Reactions  ? Chlorhexidine Itching  ? ? ?No outpatient  medications have been marked as taking for the 11/02/21 encounter (Office Visit) with Oliver Barre, MD.  ? ? ?Objective: ?BP 130/81   Pulse 84   Ht 5\' 4"  (1.626 m)   Wt 136 lb (61.7 kg)   BMI 23.34 kg/m?  ? ?Physical Exam: ? ?General: Alert and oriented. and No acute distress. ?Gait: Normal gait. ? ?Right shoulder without deformity.  Tenderness to palpation of the lateral shoulder.  Near full range of motion, with some discomfort at extremes.  She does have some obvious discomfort in the empty can testing position.  Negative belly press.  Fingers are warm and well-perfused.  Some tenderness to palpation over the bicipital groove. ? ?IMAGING: ?I personally reviewed images previously obtained in  clinic ? ?X-rays of the right shoulder were negative for acute injury, or chronic condition. ? ?New Medications:  ?No orders of the defined types were placed in this encounter. ? ? ? ? ? , MD ? ?11/02/2021 ?11:15 AM ? ? ?

## 2021-11-02 NOTE — Patient Instructions (Signed)

## 2021-11-03 ENCOUNTER — Encounter: Payer: Self-pay | Admitting: Orthopedic Surgery

## 2021-11-03 DIAGNOSIS — E1100 Type 2 diabetes mellitus with hyperosmolarity without nonketotic hyperglycemic-hyperosmolar coma (NKHHC): Secondary | ICD-10-CM | POA: Diagnosis not present

## 2021-11-03 DIAGNOSIS — I10 Essential (primary) hypertension: Secondary | ICD-10-CM | POA: Diagnosis not present

## 2021-11-03 DIAGNOSIS — R7309 Other abnormal glucose: Secondary | ICD-10-CM | POA: Diagnosis not present

## 2021-11-03 DIAGNOSIS — E785 Hyperlipidemia, unspecified: Secondary | ICD-10-CM | POA: Diagnosis not present

## 2022-02-01 DIAGNOSIS — E785 Hyperlipidemia, unspecified: Secondary | ICD-10-CM | POA: Diagnosis not present

## 2022-02-01 DIAGNOSIS — I10 Essential (primary) hypertension: Secondary | ICD-10-CM | POA: Diagnosis not present

## 2022-02-01 DIAGNOSIS — E118 Type 2 diabetes mellitus with unspecified complications: Secondary | ICD-10-CM | POA: Diagnosis not present

## 2022-02-15 DIAGNOSIS — I10 Essential (primary) hypertension: Secondary | ICD-10-CM | POA: Diagnosis not present

## 2022-02-15 DIAGNOSIS — E1122 Type 2 diabetes mellitus with diabetic chronic kidney disease: Secondary | ICD-10-CM | POA: Diagnosis not present

## 2022-02-15 DIAGNOSIS — N1831 Chronic kidney disease, stage 3a: Secondary | ICD-10-CM | POA: Diagnosis not present

## 2022-02-28 ENCOUNTER — Ambulatory Visit: Payer: Medicare Other | Admitting: Podiatry

## 2022-02-28 DIAGNOSIS — E119 Type 2 diabetes mellitus without complications: Secondary | ICD-10-CM | POA: Diagnosis not present

## 2022-02-28 DIAGNOSIS — B353 Tinea pedis: Secondary | ICD-10-CM

## 2022-02-28 DIAGNOSIS — L84 Corns and callosities: Secondary | ICD-10-CM | POA: Diagnosis not present

## 2022-02-28 MED ORDER — KETOCONAZOLE 2 % EX CREA: 1.0000 | TOPICAL_CREAM | Freq: Every day | CUTANEOUS | 2 refills | Status: DC

## 2022-02-28 NOTE — Progress Notes (Signed)
  Subjective:  Patient ID: Jocelyn Jackson, female    DOB: July 31, 1951,  MRN: [id number]  Chief Complaint  Patient presents with   Callouses   Diabetes    Diabetic foot care, callus removal bilateral feet    71 y.o. female presents with the above complaint. History confirmed with patient.  The calluses are on the great toes.  She also has dry itchy skin on both feet.  Her diabetes is well controlled she says  Objective:  Physical Exam: warm, good capillary refill, no trophic changes or ulcerative lesions, normal DP and PT pulses, normal sensory exam, tinea pedis, and mild hallux valgus deformity, she has bilateral hallux pinch calluses. Assessment:   1. Tinea pedis of both feet   2. Callus of foot   3. Type 2 diabetes mellitus without complication, without long-term current use of insulin (HCC)      Plan:  Patient was evaluated and treated and all questions answered.  Patient educated on diabetes. Discussed proper diabetic foot care and discussed risks and complications of disease. Educated patient in depth on reasons to return to the office immediately should he/she discover anything concerning or new on the feet. All questions answered. Discussed proper shoes as well.   Discussed the etiology and treatment options for tinea pedis.  Discussed topical and oral treatment.  Recommended topical treatment with 2% ketoconazole cream.  This was sent to the patient's pharmacy.  Also discussed appropriate foot hygiene, use of antifungal spray such as Tinactin in shoes, as well as cleaning her foot surfaces such as showers and bathroom floors with bleach.  All symptomatic hyperkeratoses were safely debrided with a sterile #15 blade to patient's level of comfort without incident. We discussed preventative and palliative care of these lesions including supportive and accommodative shoegear, padding, prefabricated and custom molded accommodative orthoses, use of a pumice stone and lotions/creams  daily.   Return if symptoms worsen or fail to improve.

## 2022-05-22 DIAGNOSIS — I10 Essential (primary) hypertension: Secondary | ICD-10-CM | POA: Diagnosis not present

## 2022-05-22 DIAGNOSIS — E1129 Type 2 diabetes mellitus with other diabetic kidney complication: Secondary | ICD-10-CM | POA: Diagnosis not present

## 2022-05-22 DIAGNOSIS — N1831 Chronic kidney disease, stage 3a: Secondary | ICD-10-CM | POA: Diagnosis not present

## 2022-05-29 DIAGNOSIS — N1831 Chronic kidney disease, stage 3a: Secondary | ICD-10-CM | POA: Diagnosis not present

## 2022-05-29 DIAGNOSIS — E1122 Type 2 diabetes mellitus with diabetic chronic kidney disease: Secondary | ICD-10-CM | POA: Diagnosis not present

## 2022-05-29 DIAGNOSIS — I10 Essential (primary) hypertension: Secondary | ICD-10-CM | POA: Diagnosis not present

## 2022-07-19 DIAGNOSIS — Z0000 Encounter for general adult medical examination without abnormal findings: Secondary | ICD-10-CM | POA: Diagnosis not present

## 2022-08-01 ENCOUNTER — Other Ambulatory Visit (HOSPITAL_COMMUNITY): Payer: Self-pay | Admitting: Internal Medicine

## 2022-08-01 DIAGNOSIS — Z1231 Encounter for screening mammogram for malignant neoplasm of breast: Secondary | ICD-10-CM

## 2022-09-11 ENCOUNTER — Ambulatory Visit (HOSPITAL_COMMUNITY)
Admission: RE | Admit: 2022-09-11 | Discharge: 2022-09-11 | Disposition: A | Discharge status: Home / Self Care | Payer: Medicare Other | Source: Ambulatory Visit | Attending: Internal Medicine | Admitting: Internal Medicine

## 2022-09-11 DIAGNOSIS — Z1231 Encounter for screening mammogram for malignant neoplasm of breast: Secondary | ICD-10-CM | POA: Diagnosis not present

## 2022-10-13 ENCOUNTER — Ambulatory Visit: Payer: Medicare Other | Admitting: Orthopedic Surgery

## 2022-10-13 ENCOUNTER — Encounter: Payer: Self-pay | Admitting: Orthopedic Surgery

## 2022-10-13 VITALS — BP 137/75 | HR 76 | Ht 64.0 in | Wt 135.0 lb

## 2022-10-13 DIAGNOSIS — M7581 Other shoulder lesions, right shoulder: Secondary | ICD-10-CM | POA: Diagnosis not present

## 2022-10-13 NOTE — Patient Instructions (Signed)

## 2022-10-13 NOTE — Progress Notes (Signed)
Return patient Visit  Assessment: Jocelyn Jackson is a 72 y.o. female with the following: 1. Right rotator cuff tendinitis  Plan: Jocelyn Jackson continues to have some pain in her right shoulder.  Prior injection provided close to 1 year of improved symptoms.  Pain has been worsening over the past month.  She would like to proceed with another injection.  This was completed today.  She will follow-up as needed.    Procedure note injection - Right shoulder    Verbal consent was obtained to inject the right shoulder, subacromial space Timeout was completed to confirm the site of injection.   The skin was prepped with alcohol and ethyl chloride was sprayed at the injection site.  A 21-gauge needle was used to inject 40 mg of Depo-Medrol and 1% lidocaine (4 cc) into the subacromial space of the right shoulder using a posterolateral approach.  There were no complications.  A sterile bandage was applied.    Follow-up: Return if symptoms worsen or fail to improve.  Subjective:  Chief Complaint  Patient presents with   Shoulder Pain    Rt shoulder pain for 1 mo   NDC [phone]    History of Present Illness: Jocelyn Jackson is a 72 y.o. female who returns to clinic for repeat evaluation of her right shoulder.  I saw her in clinic almost a year ago.  At that time, she had symptoms consistent with tendinitis or bursitis.  She had an injection.  She had excellent resolution of her symptoms.  However, approxi-1 month ago, her pain started to return.  Since then, she has noted progressively worsening pain.  Pain is in the same location as it was prior to the first injection.  She is interested in another injection today.   Review of Systems: No fevers or chills No numbness or tingling No chest pain No shortness of breath No bowel or bladder dysfunction No GI distress No headaches    Objective: BP 137/75   Pulse 76   Ht '5\' 4"'$  (1.626 m)   Wt 135 lb (61.2 kg)   BMI 23.17 kg/m    Physical Exam:  General: Alert and oriented. and No acute distress. Gait: Normal gait.  Right shoulder without deformity.  Tenderness to palpation of the lateral shoulder.  Near full range of motion, with some discomfort at extremes.  She does have some obvious discomfort in the empty can testing position.  Negative belly press.  Fingers are warm and well-perfused.  Some tenderness to palpation over the bicipital groove.  IMAGING: I personally reviewed images previously obtained in clinic  No new imaging obtained today.  New Medications:  No orders of the defined types were placed in this encounter.     Mordecai Rasmussen, MD  10/13/2022 9:43 PM

## 2022-10-26 DIAGNOSIS — K080 Exfoliation of teeth due to systemic causes: Secondary | ICD-10-CM | POA: Diagnosis not present

## 2022-11-20 DIAGNOSIS — L918 Other hypertrophic disorders of the skin: Secondary | ICD-10-CM | POA: Diagnosis not present

## 2022-12-05 DIAGNOSIS — K080 Exfoliation of teeth due to systemic causes: Secondary | ICD-10-CM | POA: Diagnosis not present

## 2023-02-02 ENCOUNTER — Other Ambulatory Visit (HOSPITAL_COMMUNITY): Payer: Self-pay | Admitting: Occupational Therapy

## 2023-02-02 DIAGNOSIS — K219 Gastro-esophageal reflux disease without esophagitis: Secondary | ICD-10-CM

## 2023-02-02 DIAGNOSIS — R1310 Dysphagia, unspecified: Secondary | ICD-10-CM

## 2023-02-15 DIAGNOSIS — K080 Exfoliation of teeth due to systemic causes: Secondary | ICD-10-CM | POA: Diagnosis not present

## 2023-02-19 ENCOUNTER — Encounter (HOSPITAL_COMMUNITY): Payer: Self-pay | Admitting: Speech Pathology

## 2023-02-19 ENCOUNTER — Ambulatory Visit (HOSPITAL_COMMUNITY)
Admission: RE | Admit: 2023-02-19 | Discharge: 2023-02-19 | Disposition: A | Discharge status: Home / Self Care | Payer: Medicare Other | Source: Ambulatory Visit | Attending: Internal Medicine | Admitting: Internal Medicine

## 2023-02-19 ENCOUNTER — Ambulatory Visit (HOSPITAL_COMMUNITY): Payer: Medicare Other | Attending: Internal Medicine | Admitting: Speech Pathology

## 2023-02-19 DIAGNOSIS — R1310 Dysphagia, unspecified: Secondary | ICD-10-CM

## 2023-02-19 DIAGNOSIS — R1312 Dysphagia, oropharyngeal phase: Secondary | ICD-10-CM | POA: Diagnosis not present

## 2023-02-19 DIAGNOSIS — K219 Gastro-esophageal reflux disease without esophagitis: Secondary | ICD-10-CM

## 2023-02-19 NOTE — Therapy (Signed)
Modified Barium Swallow Study  Patient Details  Name: Jocelyn Jackson MRN: [id number] Date of Birth: Dec 19, 1950  Today's Date: 02/19/2023  Modified Barium Swallow completed.  Full report located under Chart Review in the Imaging Section.  History of Present Illness Jocelyn Jackson is a 72 yo female who was referred by Dr. Carylon Jackson for MBSS due to Pt with 2-3 week history of pharyngeal globus sensation near right neck. She indicates that this occurs with solids (breads/meats) and not liquids.   Clinical Impression Oropharyngeal swallow function is [facility] for items administered (thin tsp/cup/straw, NTL, puree, regular textures, and barium tablet). Pt with min premature spillage over base of tongue with liquids (when asked to orally hold), swallow trigger generally at the level of the valleculae or pyriforms, adequate hyolaryngeal excursion and epiglottic deflection, min reduced tongue base retraction as evidenced by min vallecular residue with solids, which Pt clears with secondary swallow. Pt was unable to propel the barium tablet from the oral cavity with thin liquids, but was able to do so with puree. Pt reports no difficulty swallowing pills typically. Pt with diminished posterior pharyngeal stripping wave, but cleared pharnx. Pt with trace retention of barium in the esophagus near the distal portion of cervical hardware, which almost appeared as small Zenker's, but cleared and radiologist not convinced and possibly related to scar tissue (per radiologist). The barium tablet became transiently delayed in the distal esophagus and Pt reported globus sensation in the pharynx. The pill cleared with bite puree. Recommend regular textures and thin liquids and Pt to cut up meats well and take small bites and keep a food journal to document which foods give her trouble and consider GI consult if symptoms do not improve or get worse. Written recommendations were provided to Pt and she was given my contact  information should she have further questions. Factors that may increase risk of adverse event in presence of aspiration Jocelyn Jackson & Jocelyn Jackson 2021):    Swallow Evaluation Recommendations Recommendations: PO diet PO Diet Recommendation: Regular;Thin liquids (Level 0) Liquid Administration via: Cup;Straw Medication Administration: Whole meds with liquid Supervision: Patient able to self-feed Swallowing strategies  : Small bites/sips Postural changes: Position pt fully upright for meals;Stay upright 30-60 min after meals Oral care recommendations: Oral care BID (2x/day) Recommended consults: Consider GI consultation     Thank you,  Jocelyn Jackson, CCC-SLP [phone]  Jocelyn Jackson 02/19/2023,3:19 PM

## 2023-05-13 ENCOUNTER — Emergency Department (HOSPITAL_COMMUNITY): Payer: Medicare Other

## 2023-05-13 ENCOUNTER — Emergency Department (HOSPITAL_COMMUNITY)
Admission: EM | Admit: 2023-05-13 | Discharge: 2023-05-13 | Disposition: A | Discharge status: Home / Self Care | Payer: Medicare Other | Attending: Emergency Medicine | Admitting: Emergency Medicine

## 2023-05-13 ENCOUNTER — Encounter (HOSPITAL_COMMUNITY): Payer: Self-pay

## 2023-05-13 ENCOUNTER — Other Ambulatory Visit: Payer: Self-pay

## 2023-05-13 DIAGNOSIS — Z7982 Long term (current) use of aspirin: Secondary | ICD-10-CM | POA: Diagnosis not present

## 2023-05-13 DIAGNOSIS — M791 Myalgia, unspecified site: Secondary | ICD-10-CM | POA: Diagnosis not present

## 2023-05-13 DIAGNOSIS — S8991XA Unspecified injury of right lower leg, initial encounter: Secondary | ICD-10-CM | POA: Diagnosis not present

## 2023-05-13 DIAGNOSIS — S3991XA Unspecified injury of abdomen, initial encounter: Secondary | ICD-10-CM | POA: Diagnosis not present

## 2023-05-13 DIAGNOSIS — M545 Low back pain, unspecified: Secondary | ICD-10-CM | POA: Diagnosis not present

## 2023-05-13 DIAGNOSIS — M79661 Pain in right lower leg: Secondary | ICD-10-CM | POA: Insufficient documentation

## 2023-05-13 DIAGNOSIS — R000 Tachycardia, unspecified: Secondary | ICD-10-CM | POA: Diagnosis not present

## 2023-05-13 DIAGNOSIS — M7918 Myalgia, other site: Secondary | ICD-10-CM

## 2023-05-13 DIAGNOSIS — M542 Cervicalgia: Secondary | ICD-10-CM | POA: Insufficient documentation

## 2023-05-13 DIAGNOSIS — Y92410 Unspecified street and highway as the place of occurrence of the external cause: Secondary | ICD-10-CM | POA: Diagnosis not present

## 2023-05-13 DIAGNOSIS — S3993XA Unspecified injury of pelvis, initial encounter: Secondary | ICD-10-CM | POA: Diagnosis not present

## 2023-05-13 DIAGNOSIS — S299XXA Unspecified injury of thorax, initial encounter: Secondary | ICD-10-CM | POA: Insufficient documentation

## 2023-05-13 DIAGNOSIS — R109 Unspecified abdominal pain: Secondary | ICD-10-CM | POA: Diagnosis not present

## 2023-05-13 DIAGNOSIS — S0990XA Unspecified injury of head, initial encounter: Secondary | ICD-10-CM | POA: Insufficient documentation

## 2023-05-13 DIAGNOSIS — S3992XA Unspecified injury of lower back, initial encounter: Secondary | ICD-10-CM | POA: Diagnosis not present

## 2023-05-13 DIAGNOSIS — Z041 Encounter for examination and observation following transport accident: Secondary | ICD-10-CM | POA: Diagnosis not present

## 2023-05-13 DIAGNOSIS — I672 Cerebral atherosclerosis: Secondary | ICD-10-CM | POA: Diagnosis not present

## 2023-05-13 DIAGNOSIS — Z981 Arthrodesis status: Secondary | ICD-10-CM | POA: Diagnosis not present

## 2023-05-13 DIAGNOSIS — I6523 Occlusion and stenosis of bilateral carotid arteries: Secondary | ICD-10-CM | POA: Diagnosis not present

## 2023-05-13 LAB — COMPREHENSIVE METABOLIC PANEL
ALT: 25 U/L (ref 0–44)
AST: 32 U/L (ref 15–41)
Albumin: 4.5 g/dL (ref 3.5–5.0)
Alkaline Phosphatase: 59 U/L (ref 38–126)
Anion gap: 10 (ref 5–15)
BUN: 14 mg/dL (ref 8–23)
CO2: 25 mmol/L (ref 22–32)
Calcium: 10.4 mg/dL — ABNORMAL HIGH (ref 8.9–10.3)
Chloride: 99 mmol/L (ref 98–111)
Creatinine, Ser: 1.01 mg/dL — ABNORMAL HIGH (ref 0.44–1.00)
GFR, Estimated: 59 mL/min — ABNORMAL LOW (ref >60)
Glucose, Bld: 181 mg/dL — ABNORMAL HIGH (ref 70–99)
Potassium: 3.7 mmol/L (ref 3.5–5.1)
Sodium: 134 mmol/L — ABNORMAL LOW (ref 135–145)
Total Bilirubin: 0.4 mg/dL (ref 0.3–1.2)
Total Protein: 8.9 g/dL — ABNORMAL HIGH (ref 6.5–8.1)

## 2023-05-13 LAB — CBC WITH DIFFERENTIAL/PLATELET
Abs Immature Granulocytes: 0.02 10*3/uL (ref 0.00–0.07)
Basophils Absolute: 0 10*3/uL (ref 0.0–0.1)
Basophils Relative: 0 %
Eosinophils Absolute: 0.1 10*3/uL (ref 0.0–0.5)
Eosinophils Relative: 2 %
HCT: 36.5 % (ref 36.0–46.0)
Hemoglobin: 12.4 g/dL (ref 12.0–15.0)
Immature Granulocytes: 0 %
Lymphocytes Relative: 23 %
Lymphs Abs: 1.4 10*3/uL (ref 0.7–4.0)
MCH: 29.5 pg (ref 26.0–34.0)
MCHC: 34 g/dL (ref 30.0–36.0)
MCV: 86.9 fL (ref 80.0–100.0)
Monocytes Absolute: 0.6 10*3/uL (ref 0.1–1.0)
Monocytes Relative: 10 %
Neutro Abs: 3.8 10*3/uL (ref 1.7–7.7)
Neutrophils Relative %: 65 %
Platelets: 230 10*3/uL (ref 150–400)
RBC: 4.2 MIL/uL (ref 3.87–5.11)
RDW: 12.9 % (ref 11.5–15.5)
WBC: 5.9 10*3/uL (ref 4.0–10.5)
nRBC: 0 % (ref 0.0–0.2)

## 2023-05-13 MED ORDER — FENTANYL CITRATE PF 50 MCG/ML IJ SOSY
50.0000 ug | PREFILLED_SYRINGE | Freq: Once | INTRAMUSCULAR | Status: AC
  Administered 2023-05-13: 50 ug via INTRAVENOUS
  Filled 2023-05-13: qty 1

## 2023-05-13 MED ORDER — IOHEXOL 300 MG/ML  SOLN
100.0000 mL | Freq: Once | INTRAMUSCULAR | Status: AC | PRN
  Administered 2023-05-13: 100 mL via INTRAVENOUS

## 2023-05-13 MED ORDER — NAPROXEN 500 MG PO TABS: 500.0000 mg | ORAL_TABLET | Freq: Two times a day (BID) | ORAL | 0 refills | Status: AC | PRN

## 2023-05-13 MED ORDER — KETOROLAC TROMETHAMINE 30 MG/ML IJ SOLN
15.0000 mg | Freq: Once | INTRAMUSCULAR | Status: AC
  Administered 2023-05-13: 15 mg via INTRAVENOUS
  Filled 2023-05-13: qty 1

## 2023-05-13 MED ORDER — NAPROXEN 500 MG PO TABS: 500.0000 mg | ORAL_TABLET | Freq: Two times a day (BID) | ORAL | 0 refills | Status: DC | PRN

## 2023-05-13 NOTE — ED Notes (Signed)
Patient transported to CT 

## 2023-05-13 NOTE — ED Notes (Signed)
ED Provider at bedside. 
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
## 2023-05-15 DIAGNOSIS — K080 Exfoliation of teeth due to systemic causes: Secondary | ICD-10-CM | POA: Diagnosis not present

## 2023-05-18 DIAGNOSIS — Z79899 Other long term (current) drug therapy: Secondary | ICD-10-CM | POA: Diagnosis not present

## 2023-05-18 DIAGNOSIS — I10 Essential (primary) hypertension: Secondary | ICD-10-CM | POA: Diagnosis not present

## 2023-05-18 DIAGNOSIS — N1831 Chronic kidney disease, stage 3a: Secondary | ICD-10-CM | POA: Diagnosis not present

## 2023-05-18 DIAGNOSIS — E1129 Type 2 diabetes mellitus with other diabetic kidney complication: Secondary | ICD-10-CM | POA: Diagnosis not present

## 2023-05-24 ENCOUNTER — Other Ambulatory Visit (HOSPITAL_COMMUNITY): Payer: Self-pay | Admitting: Internal Medicine

## 2023-05-24 DIAGNOSIS — I10 Essential (primary) hypertension: Secondary | ICD-10-CM | POA: Diagnosis not present

## 2023-05-24 DIAGNOSIS — N1831 Chronic kidney disease, stage 3a: Secondary | ICD-10-CM | POA: Diagnosis not present

## 2023-05-24 DIAGNOSIS — N200 Calculus of kidney: Secondary | ICD-10-CM | POA: Diagnosis not present

## 2023-05-24 DIAGNOSIS — Z0000 Encounter for general adult medical examination without abnormal findings: Secondary | ICD-10-CM | POA: Diagnosis not present

## 2023-05-24 DIAGNOSIS — E785 Hyperlipidemia, unspecified: Secondary | ICD-10-CM | POA: Diagnosis not present

## 2023-05-24 DIAGNOSIS — Z780 Asymptomatic menopausal state: Secondary | ICD-10-CM

## 2023-05-24 DIAGNOSIS — Z0001 Encounter for general adult medical examination with abnormal findings: Secondary | ICD-10-CM | POA: Diagnosis not present

## 2023-05-24 DIAGNOSIS — Z23 Encounter for immunization: Secondary | ICD-10-CM | POA: Diagnosis not present

## 2023-05-29 ENCOUNTER — Ambulatory Visit (HOSPITAL_COMMUNITY)
Admission: RE | Admit: 2023-05-29 | Discharge: 2023-05-29 | Disposition: A | Discharge status: Home / Self Care | Payer: Medicare Other | Source: Ambulatory Visit | Attending: Internal Medicine | Admitting: Internal Medicine

## 2023-05-29 DIAGNOSIS — M85851 Other specified disorders of bone density and structure, right thigh: Secondary | ICD-10-CM | POA: Diagnosis not present

## 2023-05-29 DIAGNOSIS — M85852 Other specified disorders of bone density and structure, left thigh: Secondary | ICD-10-CM | POA: Diagnosis not present

## 2023-05-29 DIAGNOSIS — Z780 Asymptomatic menopausal state: Secondary | ICD-10-CM | POA: Insufficient documentation

## 2023-06-20 DIAGNOSIS — K080 Exfoliation of teeth due to systemic causes: Secondary | ICD-10-CM | POA: Diagnosis not present

## 2023-06-26 DIAGNOSIS — I129 Hypertensive chronic kidney disease with stage 1 through stage 4 chronic kidney disease, or unspecified chronic kidney disease: Secondary | ICD-10-CM | POA: Diagnosis not present

## 2023-06-26 DIAGNOSIS — N183 Chronic kidney disease, stage 3 unspecified: Secondary | ICD-10-CM | POA: Diagnosis not present

## 2023-06-26 DIAGNOSIS — E1122 Type 2 diabetes mellitus with diabetic chronic kidney disease: Secondary | ICD-10-CM | POA: Diagnosis not present

## 2023-08-06 ENCOUNTER — Other Ambulatory Visit (HOSPITAL_COMMUNITY): Payer: Self-pay | Admitting: Internal Medicine

## 2023-08-06 DIAGNOSIS — Z1231 Encounter for screening mammogram for malignant neoplasm of breast: Secondary | ICD-10-CM

## 2023-08-09 DIAGNOSIS — M5412 Radiculopathy, cervical region: Secondary | ICD-10-CM | POA: Diagnosis not present

## 2023-08-24 ENCOUNTER — Ambulatory Visit (HOSPITAL_COMMUNITY)
Admission: RE | Admit: 2023-08-24 | Discharge: 2023-08-24 | Disposition: A | Discharge status: Home / Self Care | Payer: Medicare Other | Source: Ambulatory Visit | Attending: Internal Medicine | Admitting: Internal Medicine

## 2023-08-24 ENCOUNTER — Other Ambulatory Visit (HOSPITAL_COMMUNITY): Payer: Self-pay | Admitting: Internal Medicine

## 2023-08-24 DIAGNOSIS — M8580 Other specified disorders of bone density and structure, unspecified site: Secondary | ICD-10-CM | POA: Diagnosis not present

## 2023-08-24 DIAGNOSIS — M542 Cervicalgia: Secondary | ICD-10-CM

## 2023-08-24 DIAGNOSIS — M4313 Spondylolisthesis, cervicothoracic region: Secondary | ICD-10-CM | POA: Diagnosis not present

## 2023-08-24 DIAGNOSIS — Z981 Arthrodesis status: Secondary | ICD-10-CM | POA: Diagnosis not present

## 2023-08-28 DIAGNOSIS — N1831 Chronic kidney disease, stage 3a: Secondary | ICD-10-CM | POA: Diagnosis not present

## 2023-08-28 DIAGNOSIS — E1129 Type 2 diabetes mellitus with other diabetic kidney complication: Secondary | ICD-10-CM | POA: Diagnosis not present

## 2023-08-28 DIAGNOSIS — I10 Essential (primary) hypertension: Secondary | ICD-10-CM | POA: Diagnosis not present

## 2023-08-28 DIAGNOSIS — E785 Hyperlipidemia, unspecified: Secondary | ICD-10-CM | POA: Diagnosis not present

## 2023-09-04 DIAGNOSIS — I10 Essential (primary) hypertension: Secondary | ICD-10-CM | POA: Diagnosis not present

## 2023-09-04 DIAGNOSIS — E1122 Type 2 diabetes mellitus with diabetic chronic kidney disease: Secondary | ICD-10-CM | POA: Diagnosis not present

## 2023-09-04 DIAGNOSIS — N1831 Chronic kidney disease, stage 3a: Secondary | ICD-10-CM | POA: Diagnosis not present

## 2023-09-14 ENCOUNTER — Ambulatory Visit (HOSPITAL_COMMUNITY)
Admission: RE | Admit: 2023-09-14 | Discharge: 2023-09-14 | Disposition: A | Discharge status: Home / Self Care | Payer: Medicare Other | Source: Ambulatory Visit | Attending: Internal Medicine | Admitting: Internal Medicine

## 2023-09-14 DIAGNOSIS — Z1231 Encounter for screening mammogram for malignant neoplasm of breast: Secondary | ICD-10-CM | POA: Insufficient documentation

## 2023-09-17 DIAGNOSIS — D485 Neoplasm of uncertain behavior of skin: Secondary | ICD-10-CM | POA: Diagnosis not present

## 2023-09-22 DIAGNOSIS — R030 Elevated blood-pressure reading, without diagnosis of hypertension: Secondary | ICD-10-CM | POA: Diagnosis not present

## 2023-09-22 DIAGNOSIS — M542 Cervicalgia: Secondary | ICD-10-CM | POA: Diagnosis not present

## 2023-10-04 DIAGNOSIS — M542 Cervicalgia: Secondary | ICD-10-CM | POA: Diagnosis not present

## 2023-10-25 ENCOUNTER — Other Ambulatory Visit: Payer: Self-pay | Admitting: Neurosurgery

## 2023-10-25 DIAGNOSIS — M542 Cervicalgia: Secondary | ICD-10-CM
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
## 2023-10-29 ENCOUNTER — Other Ambulatory Visit: Payer: Self-pay

## 2023-10-29 ENCOUNTER — Ambulatory Visit (HOSPITAL_COMMUNITY): Attending: Neurosurgery

## 2023-10-29 DIAGNOSIS — M25511 Pain in right shoulder: Secondary | ICD-10-CM | POA: Insufficient documentation

## 2023-10-29 DIAGNOSIS — M542 Cervicalgia: Secondary | ICD-10-CM | POA: Diagnosis not present

## 2023-10-29 DIAGNOSIS — M436 Torticollis: Secondary | ICD-10-CM | POA: Diagnosis not present

## 2023-10-29 DIAGNOSIS — G8929 Other chronic pain: Secondary | ICD-10-CM | POA: Diagnosis not present

## 2023-10-29 NOTE — Patient Instructions (Signed)

## 2023-11-07 ENCOUNTER — Encounter (HOSPITAL_COMMUNITY): Payer: Self-pay

## 2023-11-07 ENCOUNTER — Ambulatory Visit (HOSPITAL_COMMUNITY): Attending: Neurosurgery

## 2023-11-07 DIAGNOSIS — M25511 Pain in right shoulder: Secondary | ICD-10-CM | POA: Diagnosis not present

## 2023-11-07 DIAGNOSIS — M542 Cervicalgia: Secondary | ICD-10-CM | POA: Insufficient documentation

## 2023-11-07 DIAGNOSIS — M436 Torticollis: Secondary | ICD-10-CM | POA: Insufficient documentation

## 2023-11-07 DIAGNOSIS — G8929 Other chronic pain: Secondary | ICD-10-CM | POA: Diagnosis not present
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
## 2023-11-09 ENCOUNTER — Ambulatory Visit (HOSPITAL_COMMUNITY)

## 2023-11-09 ENCOUNTER — Encounter (HOSPITAL_COMMUNITY): Payer: Self-pay

## 2023-11-09 DIAGNOSIS — M25511 Pain in right shoulder: Secondary | ICD-10-CM | POA: Diagnosis not present

## 2023-11-09 DIAGNOSIS — G8929 Other chronic pain: Secondary | ICD-10-CM

## 2023-11-09 DIAGNOSIS — M436 Torticollis: Secondary | ICD-10-CM | POA: Diagnosis not present

## 2023-11-09 DIAGNOSIS — M542 Cervicalgia: Secondary | ICD-10-CM | POA: Diagnosis not present
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
## 2023-11-10 ENCOUNTER — Ambulatory Visit
Admission: RE | Admit: 2023-11-10 | Discharge: 2023-11-10 | Disposition: A | Discharge status: Home / Self Care | Source: Ambulatory Visit | Attending: Neurosurgery | Admitting: Neurosurgery

## 2023-11-10 DIAGNOSIS — M542 Cervicalgia: Secondary | ICD-10-CM

## 2023-11-14 ENCOUNTER — Ambulatory Visit (HOSPITAL_COMMUNITY)

## 2023-11-14 ENCOUNTER — Encounter (HOSPITAL_COMMUNITY): Payer: Self-pay

## 2023-11-14 DIAGNOSIS — M25511 Pain in right shoulder: Secondary | ICD-10-CM

## 2023-11-14 DIAGNOSIS — M542 Cervicalgia: Secondary | ICD-10-CM

## 2023-11-14 DIAGNOSIS — M436 Torticollis: Secondary | ICD-10-CM | POA: Diagnosis not present

## 2023-11-14 DIAGNOSIS — G8929 Other chronic pain: Secondary | ICD-10-CM | POA: Diagnosis not present
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
## 2023-11-16 ENCOUNTER — Encounter (HOSPITAL_COMMUNITY): Payer: Self-pay

## 2023-11-16 ENCOUNTER — Ambulatory Visit (HOSPITAL_COMMUNITY)

## 2023-11-16 DIAGNOSIS — M542 Cervicalgia: Secondary | ICD-10-CM

## 2023-11-16 DIAGNOSIS — M436 Torticollis: Secondary | ICD-10-CM

## 2023-11-16 DIAGNOSIS — M25511 Pain in right shoulder: Secondary | ICD-10-CM | POA: Diagnosis not present

## 2023-11-16 DIAGNOSIS — G8929 Other chronic pain: Secondary | ICD-10-CM
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
## 2023-11-20 ENCOUNTER — Ambulatory Visit (HOSPITAL_COMMUNITY)

## 2023-11-20 ENCOUNTER — Encounter (HOSPITAL_COMMUNITY): Payer: Self-pay

## 2023-11-20 DIAGNOSIS — M25511 Pain in right shoulder: Secondary | ICD-10-CM | POA: Diagnosis not present

## 2023-11-20 DIAGNOSIS — M542 Cervicalgia: Secondary | ICD-10-CM | POA: Diagnosis not present

## 2023-11-20 DIAGNOSIS — G8929 Other chronic pain: Secondary | ICD-10-CM

## 2023-11-20 DIAGNOSIS — M436 Torticollis: Secondary | ICD-10-CM
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
## 2023-11-22 ENCOUNTER — Encounter (HOSPITAL_COMMUNITY): Payer: Self-pay

## 2023-11-22 ENCOUNTER — Ambulatory Visit (HOSPITAL_COMMUNITY)

## 2023-11-22 DIAGNOSIS — M436 Torticollis: Secondary | ICD-10-CM

## 2023-11-22 DIAGNOSIS — G8929 Other chronic pain: Secondary | ICD-10-CM | POA: Diagnosis not present

## 2023-11-22 DIAGNOSIS — M542 Cervicalgia: Secondary | ICD-10-CM

## 2023-11-22 DIAGNOSIS — M25511 Pain in right shoulder: Secondary | ICD-10-CM | POA: Diagnosis not present
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
## 2023-11-26 DIAGNOSIS — K080 Exfoliation of teeth due to systemic causes: Secondary | ICD-10-CM | POA: Diagnosis not present

## 2023-11-27 ENCOUNTER — Ambulatory Visit (HOSPITAL_COMMUNITY)

## 2023-11-27 DIAGNOSIS — M542 Cervicalgia: Secondary | ICD-10-CM

## 2023-11-27 DIAGNOSIS — M436 Torticollis: Secondary | ICD-10-CM | POA: Diagnosis not present

## 2023-11-27 DIAGNOSIS — M25511 Pain in right shoulder: Secondary | ICD-10-CM | POA: Diagnosis not present

## 2023-11-27 DIAGNOSIS — G8929 Other chronic pain: Secondary | ICD-10-CM | POA: Diagnosis not present
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
## 2023-11-29 ENCOUNTER — Encounter (HOSPITAL_COMMUNITY)

## 2023-12-04 DIAGNOSIS — E1129 Type 2 diabetes mellitus with other diabetic kidney complication: Secondary | ICD-10-CM | POA: Diagnosis not present

## 2023-12-04 DIAGNOSIS — I10 Essential (primary) hypertension: Secondary | ICD-10-CM | POA: Diagnosis not present

## 2023-12-04 DIAGNOSIS — Z79899 Other long term (current) drug therapy: Secondary | ICD-10-CM | POA: Diagnosis not present

## 2023-12-07 DIAGNOSIS — M542 Cervicalgia: Secondary | ICD-10-CM | POA: Diagnosis not present

## 2023-12-10 DIAGNOSIS — E1122 Type 2 diabetes mellitus with diabetic chronic kidney disease: Secondary | ICD-10-CM | POA: Diagnosis not present

## 2023-12-10 DIAGNOSIS — N1831 Chronic kidney disease, stage 3a: Secondary | ICD-10-CM | POA: Diagnosis not present

## 2024-02-25 DIAGNOSIS — M940 Chondrocostal junction syndrome [Tietze]: Secondary | ICD-10-CM | POA: Diagnosis not present

## 2024-02-26 DIAGNOSIS — I509 Heart failure, unspecified: Secondary | ICD-10-CM | POA: Diagnosis not present

## 2024-02-26 DIAGNOSIS — E119 Type 2 diabetes mellitus without complications: Secondary | ICD-10-CM | POA: Diagnosis not present

## 2024-02-28 ENCOUNTER — Ambulatory Visit
Admission: EM | Admit: 2024-02-28 | Discharge: 2024-02-28 | Disposition: A | Discharge status: Home / Self Care | Attending: Internal Medicine | Admitting: Internal Medicine

## 2024-02-28 ENCOUNTER — Encounter: Payer: Self-pay | Admitting: Emergency Medicine

## 2024-02-28 DIAGNOSIS — T63441A Toxic effect of venom of bees, accidental (unintentional), initial encounter: Secondary | ICD-10-CM | POA: Diagnosis not present

## 2024-02-28 MED ORDER — DEXAMETHASONE SODIUM PHOSPHATE 10 MG/ML IJ SOLN
10.0000 mg | Freq: Once | INTRAMUSCULAR | Status: AC
  Administered 2024-02-28: 10 mg via INTRAMUSCULAR

## 2024-02-28 MED ORDER — CLARITIN REDITABS 5 MG PO TBDP: 5.0000 mg | ORAL_TABLET | Freq: Every day | ORAL | 0 refills | Status: AC

## 2024-02-28 MED ORDER — FAMOTIDINE 20 MG PO TABS: 20.0000 mg | ORAL_TABLET | Freq: Every day | ORAL | 0 refills | Status: AC

## 2024-02-28 NOTE — Discharge Instructions (Signed)
 You have been evaluated today for an allergic reaction. We gave you medicine to help with symptoms.   Take medications sent to pharmacy as directed.  Claritin  5mg  and pepcid  20mg  daily at bedtime for 7 days.   Please schedule an appointment with your primary care provider for follow-up and ongoing management. Return if you experience rashes, difficulty breathing or swallowing, lip/mouth/tongue swelling, vomiting, or for any other concerning symptoms. If symptoms are severe, please go to the ER for further workup.

## 2024-02-28 NOTE — ED Provider Notes (Signed)
 [facility]    CSN: [id number] Arrival date & time: 02/28/24  1727      History   Chief Complaint Chief Complaint  Patient presents with   Insect Bite    HPI Jocelyn Jackson is a 73 y.o. female.   Jocelyn Jackson is a 73 y.o. female presenting for chief complaint of bee sting to the dorsum of the right hand over the 2nd right MCP that happened last night as she was leaving work. The area has become slightly swollen over the last 24 hours and is very itchy. She has not noticed any hives, throat swelling, shortness of breath, chest pain, dizziness, fever/chills, or streaking redness to the dorsum of the right hand. She has taken tylenol  for localized allergic reaction with minimal relief. She takes xyzal during allergy season but has not had this recently. Denies taking any antihistamine/benadryl . She has never had an anaphylactic reaction to a bee/wasp sting.      Past Medical History:  Diagnosis Date   Anxiety    Arthritis    Chronic back pain    Diabetes mellitus without complication (HCC)    Hypertension    Lumbar radiculopathy     Patient Active Problem List   Diagnosis Date Noted   Myelopathy, spondylogenic, cervical 01/12/2016   Annual physical exam 09/13/2015    Past Surgical History:  Procedure Laterality Date   ABDOMINAL HYSTERECTOMY     cervix removed   ANTERIOR CERVICAL DECOMP/DISCECTOMY FUSION N/A 01/12/2016   Procedure: CERVICAL THREE-FOUR. CERVICAL FOUR-FIVE, CERVICAL FIVE-SIX ANTERIOR CERVICAL DISCECTOMY/FUSION;  Surgeon: Arley Helling, MD;  Location: [facility];  Service: Neurosurgery;  Laterality: N/A;   CARPAL TUNNEL RELEASE  2012   [place]   COLONOSCOPY N/A 05/06/2015   Procedure: COLONOSCOPY;  Surgeon: Claudis RAYMOND Rivet, MD;  Location: [facility];  Service: Endoscopy;  Laterality: N/A;  830    OB History   No obstetric history on file.      Home Medications    Prior to Admission medications   Medication Sig Start Date End Date  Taking? Authorizing Provider  famotidine  (PEPCID ) 20 MG tablet Take 1 tablet (20 mg total) by mouth at bedtime for 7 days. 02/28/24 03/06/24 Yes Marilea Gwynne, Dorna HERO, FNP  Loratadine  (CLARITIN  REDITABS) 5 MG TBDP Take 1 tablet (5 mg total) by mouth at bedtime for 7 days. 02/28/24 03/06/24 Yes Keevan Wolz, Dorna HERO, FNP  amLODipine  (NORVASC ) 10 MG tablet Take 10 mg by mouth daily.    [provider]  aspirin 81 MG EC tablet Take by mouth.    [provider]  atorvastatin  (LIPITOR) 10 MG tablet Take 10 mg by mouth daily. 10/27/15   [provider]  hydrochlorothiazide  (HYDRODIURIL ) 25 MG tablet TAKE ONE (1) TABLET EACH DAY 07/18/21   [provider]  irbesartan (AVAPRO) 300 MG tablet Take 300 mg by mouth daily. 09/20/21   [provider]  metFORMIN  (GLUCOPHAGE ) 500 MG tablet Take 500 mg by mouth daily with breakfast.  09/10/15   [provider]  methocarbamol  (ROBAXIN ) 500 MG tablet Take 500 mg by mouth 2 (two) times daily. 09/20/21   [provider]  naproxen  (NAPROSYN ) 500 MG tablet Take 1 tablet (500 mg total) by mouth 2 (two) times daily as needed for moderate pain. 05/13/23   Mesner, Jason, MD  pantoprazole (PROTONIX) 40 MG tablet Take 40 mg by mouth daily. 08/04/21   [provider]  spironolactone (ALDACTONE) 25 MG tablet Take 25 mg by  mouth daily. 10/13/21   [provider]    Family History Family History  Problem Relation Age of Onset   Heart disease Mother    Hypertension Mother    Diabetes Mother    Heart disease Father    Hypertension Father    Diabetes Father    Cancer Sister    Hypertension Brother    Hypertension Sister    Hypertension Sister     Social History Social History   Tobacco Use   Smoking status: Former    Current packs/day: 0.25    Average packs/day: 0.3 packs/day for 0.5 years (0.1 ttl pk-yrs)    Types: Cigarettes   Smokeless tobacco: Never  Vaping Use   Vaping status: Never Used   Substance Use Topics   Alcohol use: No   Drug use: No     Allergies   Chlorhexidine    Review of Systems Review of Systems Per HPI  Physical Exam Triage Vital Signs ED Triage Vitals  Encounter Vitals Group     BP 02/28/24 1740 (!) 147/69     Girls Systolic BP Percentile --      Girls Diastolic BP Percentile --      Boys Systolic BP Percentile --      Boys Diastolic BP Percentile --      Pulse Rate 02/28/24 1740 80     Resp 02/28/24 1740 16     Temp 02/28/24 1740 98.3 F (36.8 C)     Temp Source 02/28/24 1740 Oral     SpO2 02/28/24 1740 95 %     Weight --      Height --      Head Circumference --      Peak Flow --      Pain Score 02/28/24 1742 3     Pain Loc --      Pain Education --      Exclude from Growth Chart --    No data found.  Updated Vital Signs BP (!) 147/69 (BP Location: Right Arm)   Pulse 80   Temp 98.3 F (36.8 C) (Oral)   Resp 16   SpO2 95%   Visual Acuity Right Eye Distance:   Left Eye Distance:   Bilateral Distance:    Right Eye Near:   Left Eye Near:    Bilateral Near:     Physical Exam Vitals and nursing note reviewed.  Constitutional:      Appearance: She is not ill-appearing or toxic-appearing.  HENT:     Head: Normocephalic and atraumatic.     Right Ear: Hearing and external ear normal.     Left Ear: Hearing and external ear normal.     Nose: Nose normal.     Mouth/Throat:     Lips: Pink.  Eyes:     General: Lids are normal. Vision grossly intact. Gaze aligned appropriately.     Extraocular Movements: Extraocular movements intact.     Conjunctiva/sclera: Conjunctivae normal.  Pulmonary:     Effort: Pulmonary effort is normal.  Musculoskeletal:     Right hand: Swelling (Subtle and minimal localized swelling over the right second MCP at site of bee sting) present. No deformity, lacerations, tenderness or bony tenderness. Normal range of motion (Full ROM of right hand/wrist, non-tender, 5/5 grip strength of right hand).  Normal strength. Normal sensation (Sensation intact to distal right hand). There is no disruption of two-point discrimination. Normal capillary refill (Less than 2 cap refill distally). Normal pulse (+2 radial pulse).  Left hand: Normal.     Cervical back: Neck supple.  Skin:    General: Skin is warm and dry.     Capillary Refill: Capillary refill takes less than 2 seconds.     Findings: No rash.  Neurological:     General: No focal deficit present.     Mental Status: She is alert and oriented to person, place, and time. Mental status is at baseline.     Cranial Nerves: No dysarthria or facial asymmetry.  Psychiatric:        Mood and Affect: Mood normal.        Speech: Speech normal.        Behavior: Behavior normal.        Thought Content: Thought content normal.        Judgment: Judgment normal.      UC Treatments / Results  Labs (all labs ordered are listed, but only abnormal results are displayed) Labs Reviewed - No data to display  EKG   Radiology No results found.  Procedures Procedures (including critical care time)  Medications Ordered in UC Medications  dexamethasone  (DECADRON ) injection 10 mg (10 mg Intramuscular Given 02/28/24 1831)    Initial Impression / Assessment and Plan / UC Course  I have reviewed the triage vital signs and the nursing notes.  Pertinent labs & imaging results that were available during my care of the patient were reviewed by me and considered in my medical decision making (see chart for details).   1. Bee sting, accidental or unintentional Presentation consistent with acute hypersensitivity reaction, likely acute mild allergic reaction.  No signs of anaphylaxis, HEENT exam stable, lungs clear.  Will treat with steroid, antihistamines, H2 blocker (famotidine ) and supportive care as outlined in AVS. Renal function reviewed.  Given dexamethasone  10mg  IM in clinic for acute symptoms.  History of CKD and type 2 diabetes that is well  controlled. We will trial treatment with steroid injection instead of prednisone  burst/pills.  Advised to avoid known and potential allergens. Follow-up with PCP.   Counseled patient on potential for adverse effects with medications prescribed/recommended today, strict ER and return-to-clinic precautions discussed, patient verbalized understanding.    Final Clinical Impressions(s) / UC Diagnoses   Final diagnoses:  Bee sting, accidental or unintentional, initial encounter     Discharge Instructions      You have been evaluated today for an allergic reaction. We gave you medicine to help with symptoms.   Take medications sent to pharmacy as directed.  Claritin  5mg  and pepcid  20mg  daily at bedtime for 7 days.   Please schedule an appointment with your primary care provider for follow-up and ongoing management. Return if you experience rashes, difficulty breathing or swallowing, lip/mouth/tongue swelling, vomiting, or for any other concerning symptoms. If symptoms are severe, please go to the ER for further workup.     ED Prescriptions     Medication Sig Dispense Auth. Provider   Loratadine  (CLARITIN  REDITABS) 5 MG TBDP Take 1 tablet (5 mg total) by mouth at bedtime for 7 days. 7 tablet Enedelia Going M, FNP   famotidine  (PEPCID ) 20 MG tablet Take 1 tablet (20 mg total) by mouth at bedtime for 7 days. 7 tablet Enedelia Going HERO, FNP      PDMP not reviewed this encounter.   Enedelia Going HERO, [place] 02/28/24 1955

## 2024-02-28 NOTE — ED Triage Notes (Addendum)
 Pt presents after being stung by a bee yesterday on right hand. Hand is swollen, sore, and itchy.   Denies sob or throat swelling. This is the first time she has been stung by bee.

## 2024-03-12 DIAGNOSIS — N1831 Chronic kidney disease, stage 3a: Secondary | ICD-10-CM | POA: Diagnosis not present

## 2024-03-12 DIAGNOSIS — E1129 Type 2 diabetes mellitus with other diabetic kidney complication: Secondary | ICD-10-CM | POA: Diagnosis not present

## 2024-03-12 DIAGNOSIS — Z79899 Other long term (current) drug therapy: Secondary | ICD-10-CM | POA: Diagnosis not present

## 2024-03-13 DIAGNOSIS — M25511 Pain in right shoulder: Secondary | ICD-10-CM | POA: Diagnosis not present

## 2024-03-19 DIAGNOSIS — I10 Essential (primary) hypertension: Secondary | ICD-10-CM | POA: Diagnosis not present

## 2024-03-19 DIAGNOSIS — E1122 Type 2 diabetes mellitus with diabetic chronic kidney disease: Secondary | ICD-10-CM | POA: Diagnosis not present

## 2024-03-19 DIAGNOSIS — N1831 Chronic kidney disease, stage 3a: Secondary | ICD-10-CM | POA: Diagnosis not present

## 2024-05-08 ENCOUNTER — Other Ambulatory Visit: Payer: Self-pay | Admitting: Neurosurgery

## 2024-05-08 DIAGNOSIS — M25511 Pain in right shoulder: Secondary | ICD-10-CM | POA: Diagnosis not present

## 2024-05-09 ENCOUNTER — Encounter: Payer: Self-pay | Admitting: Neurosurgery

## 2024-05-28 ENCOUNTER — Ambulatory Visit
Admission: RE | Admit: 2024-05-28 | Discharge: 2024-05-28 | Disposition: A | Source: Ambulatory Visit | Attending: Neurosurgery | Admitting: Neurosurgery

## 2024-05-28 DIAGNOSIS — M25511 Pain in right shoulder: Secondary | ICD-10-CM

## 2024-05-28 DIAGNOSIS — M75121 Complete rotator cuff tear or rupture of right shoulder, not specified as traumatic: Secondary | ICD-10-CM | POA: Diagnosis not present

## 2024-05-31 DIAGNOSIS — E1129 Type 2 diabetes mellitus with other diabetic kidney complication: Secondary | ICD-10-CM | POA: Diagnosis not present

## 2024-06-02 DIAGNOSIS — E559 Vitamin D deficiency, unspecified: Secondary | ICD-10-CM | POA: Diagnosis not present

## 2024-06-02 DIAGNOSIS — Z79899 Other long term (current) drug therapy: Secondary | ICD-10-CM | POA: Diagnosis not present

## 2024-06-02 DIAGNOSIS — E1129 Type 2 diabetes mellitus with other diabetic kidney complication: Secondary | ICD-10-CM | POA: Diagnosis not present

## 2024-06-10 DIAGNOSIS — E1129 Type 2 diabetes mellitus with other diabetic kidney complication: Secondary | ICD-10-CM | POA: Diagnosis not present

## 2024-06-10 DIAGNOSIS — Z0001 Encounter for general adult medical examination with abnormal findings: Secondary | ICD-10-CM | POA: Diagnosis not present

## 2024-06-10 DIAGNOSIS — N1831 Chronic kidney disease, stage 3a: Secondary | ICD-10-CM | POA: Diagnosis not present

## 2024-06-10 DIAGNOSIS — I10 Essential (primary) hypertension: Secondary | ICD-10-CM | POA: Diagnosis not present

## 2024-06-16 DIAGNOSIS — K080 Exfoliation of teeth due to systemic causes: Secondary | ICD-10-CM | POA: Diagnosis not present

## 2024-07-01 ENCOUNTER — Other Ambulatory Visit: Payer: Self-pay

## 2024-07-01 ENCOUNTER — Emergency Department (HOSPITAL_COMMUNITY)
Admission: EM | Admit: 2024-07-01 | Discharge: 2024-07-01 | Disposition: A | Discharge status: Home / Self Care | Attending: Emergency Medicine | Admitting: Emergency Medicine

## 2024-07-01 ENCOUNTER — Encounter (HOSPITAL_COMMUNITY): Payer: Self-pay | Admitting: *Deleted

## 2024-07-01 ENCOUNTER — Emergency Department (HOSPITAL_COMMUNITY)

## 2024-07-01 DIAGNOSIS — Z7982 Long term (current) use of aspirin: Secondary | ICD-10-CM | POA: Insufficient documentation

## 2024-07-01 DIAGNOSIS — I10 Essential (primary) hypertension: Secondary | ICD-10-CM | POA: Insufficient documentation

## 2024-07-01 DIAGNOSIS — E119 Type 2 diabetes mellitus without complications: Secondary | ICD-10-CM | POA: Diagnosis not present

## 2024-07-01 DIAGNOSIS — Z87891 Personal history of nicotine dependence: Secondary | ICD-10-CM | POA: Diagnosis not present

## 2024-07-01 DIAGNOSIS — Z7984 Long term (current) use of oral hypoglycemic drugs: Secondary | ICD-10-CM | POA: Insufficient documentation

## 2024-07-01 DIAGNOSIS — N281 Cyst of kidney, acquired: Secondary | ICD-10-CM | POA: Diagnosis not present

## 2024-07-01 DIAGNOSIS — R9431 Abnormal electrocardiogram [ECG] [EKG]: Secondary | ICD-10-CM | POA: Diagnosis not present

## 2024-07-01 DIAGNOSIS — G8929 Other chronic pain: Secondary | ICD-10-CM | POA: Diagnosis not present

## 2024-07-01 DIAGNOSIS — I2510 Atherosclerotic heart disease of native coronary artery without angina pectoris: Secondary | ICD-10-CM | POA: Insufficient documentation

## 2024-07-01 DIAGNOSIS — Z79899 Other long term (current) drug therapy: Secondary | ICD-10-CM | POA: Insufficient documentation

## 2024-07-01 DIAGNOSIS — Z8249 Family history of ischemic heart disease and other diseases of the circulatory system: Secondary | ICD-10-CM | POA: Insufficient documentation

## 2024-07-01 DIAGNOSIS — K429 Umbilical hernia without obstruction or gangrene: Secondary | ICD-10-CM | POA: Diagnosis not present

## 2024-07-01 DIAGNOSIS — I700 Atherosclerosis of aorta: Secondary | ICD-10-CM | POA: Insufficient documentation

## 2024-07-01 DIAGNOSIS — R079 Chest pain, unspecified: Secondary | ICD-10-CM

## 2024-07-01 DIAGNOSIS — R0789 Other chest pain: Secondary | ICD-10-CM | POA: Diagnosis not present

## 2024-07-01 LAB — CBC WITH DIFFERENTIAL/PLATELET
Abs Immature Granulocytes: 0.01 K/uL (ref 0.00–0.07)
Basophils Absolute: 0 K/uL (ref 0.0–0.1)
Basophils Relative: 0 %
Eosinophils Absolute: 0.1 K/uL (ref 0.0–0.5)
Eosinophils Relative: 1 %
HCT: 35.2 % — ABNORMAL LOW (ref 36.0–46.0)
Hemoglobin: 12.1 g/dL (ref 12.0–15.0)
Immature Granulocytes: 0 %
Lymphocytes Relative: 28 %
Lymphs Abs: 1.2 K/uL (ref 0.7–4.0)
MCH: 29.7 pg (ref 26.0–34.0)
MCHC: 34.4 g/dL (ref 30.0–36.0)
MCV: 86.5 fL (ref 80.0–100.0)
Monocytes Absolute: 0.5 K/uL (ref 0.1–1.0)
Monocytes Relative: 11 %
Neutro Abs: 2.6 K/uL (ref 1.7–7.7)
Neutrophils Relative %: 60 %
Platelets: 245 K/uL (ref 150–400)
RBC: 4.07 MIL/uL (ref 3.87–5.11)
RDW: 12.4 % (ref 11.5–15.5)
WBC: 4.3 K/uL (ref 4.0–10.5)
nRBC: 0 % (ref 0.0–0.2)

## 2024-07-01 LAB — PRO BRAIN NATRIURETIC PEPTIDE: Pro Brain Natriuretic Peptide: <50 pg/mL (ref <300.0)

## 2024-07-01 LAB — COMPREHENSIVE METABOLIC PANEL WITH GFR
ALT: 72 U/L — ABNORMAL HIGH (ref 0–44)
AST: 145 U/L — ABNORMAL HIGH (ref 15–41)
Albumin: 4.8 g/dL (ref 3.5–5.0)
Alkaline Phosphatase: 86 U/L (ref 38–126)
Anion gap: 10 (ref 5–15)
BUN: 13 mg/dL (ref 8–23)
CO2: 27 mmol/L (ref 22–32)
Calcium: 10.6 mg/dL — ABNORMAL HIGH (ref 8.9–10.3)
Chloride: 104 mmol/L (ref 98–111)
Creatinine, Ser: 1.06 mg/dL — ABNORMAL HIGH (ref 0.44–1.00)
GFR, Estimated: 55 mL/min — ABNORMAL LOW (ref >60)
Glucose, Bld: 125 mg/dL — ABNORMAL HIGH (ref 70–99)
Potassium: 3.9 mmol/L (ref 3.5–5.1)
Sodium: 140 mmol/L (ref 135–145)
Total Bilirubin: 0.5 mg/dL (ref 0.0–1.2)
Total Protein: 8.4 g/dL — ABNORMAL HIGH (ref 6.5–8.1)

## 2024-07-01 LAB — LIPASE, BLOOD: Lipase: 46 U/L (ref 11–51)

## 2024-07-01 LAB — TROPONIN T, HIGH SENSITIVITY
Troponin T High Sensitivity: <15 ng/L (ref 0–19)
Troponin T High Sensitivity: <15 ng/L (ref 0–19)

## 2024-07-01 MED ORDER — SUCRALFATE 1 G PO TABS: 1.0000 g | ORAL_TABLET | Freq: Three times a day (TID) | ORAL | 0 refills | Status: AC

## 2024-07-01 MED ORDER — SODIUM CHLORIDE 0.9 % IV BOLUS
1000.0000 mL | Freq: Once | INTRAVENOUS | Status: AC
  Administered 2024-07-01: 1000 mL via INTRAVENOUS

## 2024-07-01 MED ORDER — FAMOTIDINE IN NACL 20-0.9 MG/50ML-% IV SOLN
20.0000 mg | Freq: Once | INTRAVENOUS | Status: AC
  Administered 2024-07-01: 20 mg via INTRAVENOUS
  Filled 2024-07-01: qty 50

## 2024-07-01 MED ORDER — PANTOPRAZOLE SODIUM 40 MG PO TBEC: 40.0000 mg | DELAYED_RELEASE_TABLET | Freq: Every day | ORAL | 0 refills | Status: AC

## 2024-07-01 MED ORDER — IOHEXOL 350 MG/ML SOLN
100.0000 mL | Freq: Once | INTRAVENOUS | Status: AC | PRN
  Administered 2024-07-01: 100 mL via INTRAVENOUS

## 2024-07-01 MED ORDER — ALUM & MAG HYDROXIDE-SIMETH 200-200-20 MG/5ML PO SUSP
30.0000 mL | Freq: Once | ORAL | Status: AC
  Administered 2024-07-01: 30 mL via ORAL
  Filled 2024-07-01: qty 30

## 2024-07-01 NOTE — Discharge Instructions (Addendum)
 It was a pleasure caring for you today in the emergency department.  Return to the Emergency Department if you have unusual chest pain, pressure, or discomfort, shortness of breath, nausea, vomiting, burping, heartburn, tingling upper body parts, sweating, cold, clammy skin, or racing heartbeat. Call 911 if you think you are having a heart attack. Take all cardiac medications as prescribed - notify your doctor if you have any side effects. Follow cardiac diet - avoid fatty & fried foods, don't eat too much red meat, eat lots of fruits & vegetables, and dairy products should be low fat. Please lose weight if you are overweight. Become more active with walking, gardening, or any other activity that gets you to moving.    You have been referred to cardiologist in [place], please follow up with them in the office  Please return to the emergency department immediately for any new or concerning symptoms, or if you get worse.

## 2024-07-01 NOTE — ED Provider Notes (Signed)
 [place] EMERGENCY DEPARTMENT AT [facility] Provider Note  CSN: [id number] Arrival date & time: 07/01/24 9170  Chief Complaint(s) Chest Pain  HPI Jocelyn Jackson is a 73 y.o. female with past medical history as below, significant for anxiety, arthritis, DM, hypertension, chronic back pain who presents to the ED with complaint of chest pain  Reports acute onset of midsternal chest pain this morning while she was getting ready.  Sharp and stabbing sensation.  Does not radiate.  No associated diaphoresis, dyspnea, nausea or vomiting.  Does feel somewhat lightheaded with ambulation.  Normal state felt prior to onset of the symptoms.  Denies similar symptoms in the past.  Reports compliance with her typical medications.  No recent falls or head injuries.  No recent travel, no fevers.  No leg swelling.  Past Medical History Past Medical History:  Diagnosis Date   Anxiety    Arthritis    Chronic back pain    Diabetes mellitus without complication (HCC)    Hypertension    Lumbar radiculopathy    Patient Active Problem List   Diagnosis Date Noted   Myelopathy, spondylogenic, cervical 01/12/2016   Annual physical exam 09/13/2015   Home Medication(s) Prior to Admission medications   Medication Sig Start Date End Date Taking? Authorizing Provider  amLODipine  (NORVASC ) 10 MG tablet Take 10 mg by mouth daily.    [provider]  aspirin 81 MG EC tablet Take by mouth.    [provider]  atorvastatin  (LIPITOR) 10 MG tablet Take 10 mg by mouth daily. 10/27/15   [provider]  famotidine  (PEPCID ) 20 MG tablet Take 1 tablet (20 mg total) by mouth at bedtime for 7 days. 02/28/24 03/06/24  Enedelia Dorna HERO, FNP  hydrochlorothiazide  (HYDRODIURIL ) 25 MG tablet TAKE ONE (1) TABLET EACH DAY 07/18/21   [provider]  irbesartan (AVAPRO) 300 MG tablet Take 300 mg by mouth daily. 09/20/21   [provider]  Loratadine  (CLARITIN  REDITABS) 5 MG  TBDP Take 1 tablet (5 mg total) by mouth at bedtime for 7 days. 02/28/24 03/06/24  Enedelia Dorna HERO, FNP  metFORMIN  (GLUCOPHAGE ) 500 MG tablet Take 500 mg by mouth daily with breakfast.  09/10/15   [provider]  methocarbamol  (ROBAXIN ) 500 MG tablet Take 500 mg by mouth 2 (two) times daily. 09/20/21   [provider]  naproxen  (NAPROSYN ) 500 MG tablet Take 1 tablet (500 mg total) by mouth 2 (two) times daily as needed for moderate pain. 05/13/23   Mesner, Selinda, MD  pantoprazole  (PROTONIX ) 40 MG tablet Take 40 mg by mouth daily. 08/04/21   [provider]  spironolactone (ALDACTONE) 25 MG tablet Take 25 mg by mouth daily. 10/13/21   [provider]  Past Surgical History Past Surgical History:  Procedure Laterality Date   ABDOMINAL HYSTERECTOMY     cervix removed   ANTERIOR CERVICAL DECOMP/DISCECTOMY FUSION N/A 01/12/2016   Procedure: CERVICAL THREE-FOUR. CERVICAL FOUR-FIVE, CERVICAL FIVE-SIX ANTERIOR CERVICAL DISCECTOMY/FUSION;  Surgeon: Arley Helling, MD;  Location: [facility];  Service: Neurosurgery;  Laterality: N/A;   CARPAL TUNNEL RELEASE  2012   [place]   COLONOSCOPY N/A 05/06/2015   Procedure: COLONOSCOPY;  Surgeon: Claudis RAYMOND Rivet, MD;  Location: [facility];  Service: Endoscopy;  Laterality: N/A;  830   Family History Family History  Problem Relation Age of Onset   Heart disease Mother    Hypertension Mother    Diabetes Mother    Heart disease Father    Hypertension Father    Diabetes Father    Cancer Sister    Hypertension Brother    Hypertension Sister    Hypertension Sister     Social History Social History   Tobacco Use   Smoking status: Former    Current packs/day: 0.25    Average packs/day: 0.3 packs/day for 0.5 years (0.1 ttl pk-yrs)    Types: Cigarettes   Smokeless tobacco: Never  Vaping Use    Vaping status: Never Used  Substance Use Topics   Alcohol use: No   Drug use: No   Allergies Chlorhexidine   Review of Systems A thorough review of systems was obtained and all systems are negative except as noted in the HPI and PMH.   Physical Exam Vital Signs  I have reviewed the triage vital signs BP 139/65 (BP Location: Left Arm)   Pulse 76   Temp 97.9 F (36.6 C) (Oral)   Resp 18   Ht 5' 2 (1.575 m)   Wt 60.8 kg   SpO2 100%   BMI 24.51 kg/m  Physical Exam  ED Results and Treatments Labs (all labs ordered are listed, but only abnormal results are displayed) Labs Reviewed  CBC WITH DIFFERENTIAL/PLATELET - Abnormal; Notable for the following components:      Result Value   HCT 35.2 (*)    All other components within normal limits  COMPREHENSIVE METABOLIC PANEL WITH GFR  PRO BRAIN NATRIURETIC PEPTIDE  LIPASE, BLOOD  TROPONIN T, HIGH SENSITIVITY                                                                                                                          Radiology DG Chest Port 1 View Result Date: 07/01/2024 CLINICAL DATA:  cp EXAM: PORTABLE CHEST - 1 VIEW COMPARISON:  July 02, 2011, May 13, 2023 FINDINGS: No focal airspace consolidation, pleural effusion, or pneumothorax. No cardiomegaly. Aortic atherosclerosis. No acute fracture or destructive lesions. Multilevel thoracic osteophytosis. IMPRESSION: No acute cardiopulmonary abnormality. Electronically Signed   By: Rogelia Myers M.D.   On: 07/01/2024 08:56    Pertinent labs & imaging results that were available during my care of the patient were reviewed by me and considered in my medical  decision making (see MDM for details).  Medications Ordered in ED Medications  sodium chloride  0.9 % bolus 1,000 mL (has no administration in time range)                                                                                                                                      Procedures Procedures  (including critical care time)  Medical Decision Making / ED Course    Medical Decision Making:    Jocelyn Jackson is a 73 y.o. female ***. The complaint involves an extensive differential diagnosis and also carries with it a high risk of complications and morbidity.  Serious etiology was considered. Ddx includes but is not limited to: ***  Complete initial physical exam performed, notably the patient was in ***.    Reviewed and confirmed nursing documentation for past medical history, family history, social history.  Vital signs reviewed.    ***       ***               Additional history obtained: -Additional history obtained from {wsadditionalhistorian:[id number]} -External records from outside source obtained and reviewed including: Chart review including previous notes, labs, imaging, consultation notes including  ***   Lab Tests: -I ordered, reviewed, and interpreted labs.   The pertinent results include:   Labs Reviewed  CBC WITH DIFFERENTIAL/PLATELET - Abnormal; Notable for the following components:      Result Value   HCT 35.2 (*)    All other components within normal limits  COMPREHENSIVE METABOLIC PANEL WITH GFR  PRO BRAIN NATRIURETIC PEPTIDE  LIPASE, BLOOD  TROPONIN T, HIGH SENSITIVITY    Notable for ***  EKG   EKG Interpretation Date/Time:  Tuesday July 01 2024 08:37:43 EST Ventricular Rate:  71 PR Interval:  184 QRS Duration:  96 QT Interval:  383 QTC Calculation: 417 R Axis:   -3  Text Interpretation: Sinus rhythm Abnormal R-wave progression, early transition Interpretation limited secondary to artifact no stemi similar to prior Confirmed by Elnor Savant ([id number]) on 07/01/2024 8:42:22 AM         Imaging Studies ordered: I ordered imaging studies including *** I independently visualized the following imaging with scope of interpretation limited to determining acute life threatening conditions related to  emergency care; findings noted above I agree with the radiologist interpretation If any imaging was obtained with contrast I closely monitored patient for any possible adverse reaction a/w contrast administration in the emergency department   Medicines ordered and prescription drug management: Meds ordered this encounter  Medications   sodium chloride  0.9 % bolus 1,000 mL    -I have reviewed the patients home medicines and have made adjustments as needed   Consultations Obtained: I requested consultation with the ***,  and discussed lab and imaging findings as well as pertinent plan - they recommend: ***   Cardiac Monitoring: The patient was maintained on a cardiac  monitor.  I personally viewed and interpreted the cardiac monitored which showed an underlying rhythm of: *** Continuous pulse oximetry interpreted by myself, ***% on ***.    Social Determinants of Health:  Diagnosis or treatment significantly limited by social determinants of health: {wssoc:28071}   Reevaluation: After the interventions noted above, I reevaluated the patient and found that they have {resolved/improved/worsened:23923::improved}  Co morbidities that complicate the patient evaluation  Past Medical History:  Diagnosis Date   Anxiety    Arthritis    Chronic back pain    Diabetes mellitus without complication (HCC)    Hypertension    Lumbar radiculopathy       Dispostion: Disposition decision including need for hospitalization was considered, and patient {wsdispo:28070::discharged from emergency department.}    Final Clinical Impression(s) / ED Diagnoses Final diagnoses:  None

## 2024-07-01 NOTE — ED Triage Notes (Signed)
 Pt states she was outside cutting her lights on when she started to have chest pain; pt describes the pain as a pressure and states she felt like she was going to pass out when the pain first started

## 2024-08-01 ENCOUNTER — Encounter: Payer: Self-pay | Admitting: Cardiology

## 2024-08-01 ENCOUNTER — Ambulatory Visit: Attending: Cardiology | Admitting: Cardiology

## 2024-08-01 ENCOUNTER — Telehealth (HOSPITAL_COMMUNITY): Payer: Self-pay | Admitting: *Deleted

## 2024-08-01 VITALS — BP 140/80 | HR 74 | Ht 62.0 in | Wt 129.0 lb

## 2024-08-01 DIAGNOSIS — R072 Precordial pain: Secondary | ICD-10-CM | POA: Diagnosis not present

## 2024-08-01 DIAGNOSIS — E782 Mixed hyperlipidemia: Secondary | ICD-10-CM | POA: Insufficient documentation

## 2024-08-01 DIAGNOSIS — I2510 Atherosclerotic heart disease of native coronary artery without angina pectoris: Secondary | ICD-10-CM | POA: Diagnosis not present

## 2024-08-01 DIAGNOSIS — I10 Essential (primary) hypertension: Secondary | ICD-10-CM | POA: Diagnosis not present

## 2024-08-01 DIAGNOSIS — E119 Type 2 diabetes mellitus without complications: Secondary | ICD-10-CM | POA: Insufficient documentation

## 2024-08-01 LAB — LIPID PANEL
Chol/HDL Ratio: 2.7 ratio (ref 0.0–4.4)
Cholesterol, Total: 136 mg/dL (ref 100–199)
HDL: 51 mg/dL
LDL Chol Calc (NIH): 70 mg/dL (ref 0–99)
Triglycerides: 77 mg/dL (ref 0–149)
VLDL Cholesterol Cal: 15 mg/dL (ref 5–40)

## 2024-08-01 NOTE — Patient Instructions (Addendum)
 Medication Instructions:  Please START taking sublingual nitroglycerin as needed.   *If you need a refill on your cardiac medications before your next appointment, please call your pharmacy*  Lab Work: Please go to our first floor lab before you leave so they can draw a lipid panel.   If you have labs (blood work) drawn today and your tests are completely normal, you will receive your results only by: MyChart Message (if you have MyChart) OR A paper copy in the mail If you have any lab test that is abnormal or we need to change your treatment, we will call you to review the results.  Testing/Procedures: Your physician has requested that you have an echocardiogram. Echocardiography is a painless test that uses sound waves to create images of your heart. It provides your doctor with information about the size and shape of your heart and how well your hearts chambers and valves are working. This procedure takes approximately one hour. There are no restrictions for this procedure. Please do NOT wear cologne, perfume, aftershave, or lotions (deodorant is allowed). Please arrive 15 minutes prior to your appointment time.  Please note: We ask at that you not bring children with you during ultrasound (echo/ vascular) testing. Due to room size and safety concerns, children are not allowed in the ultrasound rooms during exams. Our front office staff cannot provide observation of children in our lobby area while testing is being conducted. An adult accompanying a patient to their appointment will only be allowed in the ultrasound room at the discretion of the ultrasound technician under special circumstances. We apologize for any inconvenience.  You are scheduled for a Myocardial Perfusion Imaging Study on:  __________ at ____________.  Please arrive 15 minutes prior to your appointment time for registration and insurance purposes.  The test will take approximately 3 to 4 hours to complete; you may  bring reading material.  If someone comes with you to your appointment, they will need to remain in the main lobby due to limited space in the testing area. **If you are pregnant or breastfeeding, please notify the nuclear lab prior to your appointment**  How to prepare for your Myocardial Perfusion Test: Do not eat or drink 3 hours prior to your test, except you may have water . Do not consume products containing caffeine (regular or decaffeinated) 12 hours prior to your test. (ex: coffee, chocolate, sodas, tea). Do bring a list of your current medications with you.  If not listed below, you may take your medications as normal. Do wear comfortable clothes (no dresses or overalls) and walking shoes, tennis shoes preferred (No heels or open toe shoes are allowed). Do NOT wear cologne, perfume, aftershave, or lotions (deodorant is allowed). If these instructions are not followed, your test will have to be rescheduled.  Please report to [address] ([facility] 2nd Floor, for your test.  If you have questions or concerns about your appointment, you can call the Nuclear Lab at [phone].  If you cannot keep your appointment, please provide 24 hours notification to the Nuclear Lab, to avoid a possible $50 charge to your account.  Follow-Up: At [facility], you and your health needs are our priority.  As part of our continuing mission to provide you with exceptional heart care, our providers are all part of one team.  This team includes your primary Cardiologist (physician) and Advanced Practice Providers or APPs (Physician Assistants and Nurse Practitioners) who  all work together to provide you with the care you need, when you need it.  Your next appointment:   8-12 week(s)  Provider:   Josefa Beauvais, NP, Orren Fabry, PA-C, Jon Hails, PA-C, Dayna Dunn, PA-C, Madison Fountain, NP, Callie Goodrich, PA-C, Sheng Haley, PA-C, Michele  Lenze, PA-C, Hao Meng, PA-C, Damien Braver, NP, or Glendia Ferrier, PA-C        We recommend signing up for the patient portal called MyChart.  Sign up information is provided on this After Visit Summary.  MyChart is used to connect with patients for Virtual Visits (Telemedicine).  Patients are able to view lab/test results, encounter notes, upcoming appointments, etc.  Non-urgent messages can be sent to your provider as well.   To learn more about what you can do with MyChart, go to [email or web address].

## 2024-08-01 NOTE — Progress Notes (Signed)
 " Cardiology Office Note:  .   Date:  08/01/2024  ID:  Jocelyn Jackson, DOB 03-Sep-1950, MRN [id number] PCP: Sheryle Carwin, MD  [facility] Providers Cardiologist:  Newman Lawrence, MD PCP: Sheryle Carwin, MD  Chief Complaint  Patient presents with   Precordial pain     Jocelyn Jackson is a 73 y.o. female with hypertension, hyperlipidemia, type 2 diabetes mellitus, CAD, chest pain  Discussed the use of AI scribe software for clinical note transcription with the patient, who gave verbal consent to proceed.  History of Present Illness Jocelyn Jackson is a 73 year old female with hypertension and diabetes who presents with chest pain. She was referred by the emergency room for evaluation of chest pain and calcium  buildup in heart arteries.  She describes intermittent mid-chest pain that began after bending down to pick something up. On standing she felt dizzy and nauseated. Pain worsened when she lay down and lasted about an hour initially, with episodes now lasting about 15-20 minutes. It is triggered by certain movements such as bending over or standing up quickly and does not occur with walking. A GI cocktail provided partial relief.  She has intermittent dizziness. Home blood pressures are usually around 120/60 mmHg, though her blood pressure was mildly elevated today.  She has hypertension treated with amlodipine , hydrochlorothiazide , irbesartan, and spironolactone, diabetes on metformin , and hyperlipidemia on atorvastatin  10 mg. She takes a daily baby aspirin.  Family history includes diabetes and heart problems. She does not use tobacco or alcohol. She is active and walks three times per week.  She denies shortness of breath, syncope, or palpitations.      Vitals:   08/01/24 0817  SpO2: 97%      Review of Systems  Cardiovascular:  Positive for chest pain. Negative for dyspnea on exertion, leg swelling, palpitations and syncope.  Neurological:  Positive for dizziness.         Studies Reviewed: Jocelyn Jackson        EKG 08/01/2024: Sinus rhythm with Premature atrial complexes Minimal voltage criteria for LVH, may be normal variant ( R in aVL ) When compared with ECG of 01-Jul-2024 08:37,  PAC new     CTA chest/abdomen/pelvis 06/2024: 1. No evidence of acute aortic syndrome. 2. Mild-to-moderate calcific coronary artery disease and mild-to-moderate calcific atheromatous disease of the abdominal aorta.    Labs 06/2024: HbA1C 7.3% Hb 12.1 Cr 1.06, eGFR 55 Trop HS <15    Physical Exam Vitals and nursing note reviewed.  Constitutional:      General: She is not in acute distress. Neck:     Vascular: No JVD.  Cardiovascular:     Rate and Rhythm: Normal rate and regular rhythm.     Heart sounds: Murmur heard.     Harsh midsystolic murmur is present with a grade of 1/6 at the upper right sternal border radiating to the neck.  Pulmonary:     Effort: Pulmonary effort is normal.     Breath sounds: Normal breath sounds. No wheezing or rales.  Musculoskeletal:     Right lower leg: No edema.     Left lower leg: No edema.      VISIT DIAGNOSES:   ICD-10-CM   1. Precordial pain  R07.2 EKG 12-Lead    MYOCARDIAL PERFUSION IMAGING    2. Coronary artery disease involving native coronary artery of native heart without angina pectoris  I25.10 MYOCARDIAL PERFUSION IMAGING    Lipid panel    CANCELED: Lipid panel  3. Primary hypertension  I10 ECHOCARDIOGRAM COMPLETE    4. Type 2 diabetes mellitus without complication, without long-term current use of insulin  (HCC)  E11.9     5. Mixed hyperlipidemia  E78.2        Jocelyn Jackson is a 73 y.o. female with hypertension, hyperlipidemia, type 2 diabetes mellitus, CAD, chest pain  Assessment & Plan Precordial chest pain: Intermittent chest pain, atypical presentation, coronary artery disease not ruled out. - Ordered stress test with treadmill and imaging of heart arteries. - Ordered echocardiogram to assess  heart function. - Prescribed nitroglycerin for as-needed use if severe chest pain persists for more than 5 minutes. - Advised to call 911 if experiencing dizziness or low blood pressure with chest pain.  Atherosclerotic heart disease of native coronary artery without angina pectoris: Calcium  buildup in heart arteries, risk factors include hypertension, hyperlipidemia, and family history. - Continue current medications including aspirin, blood pressure medications, and atorvastatin . - Checked cholesterol levels today.  Primary hypertension: Blood pressure slightly elevated today, generally well-controlled at home. - Continue current blood pressure medications. - Advised to monitor blood pressure regularly at home. - Ensure adequate hydration with at least 2 liters of water  daily.  Type 2 diabetes mellitus: Managed with metformin . - Continue metformin  as prescribed.  Mixed hyperlipidemia: Managed with atorvastatin  10 mg. - Checked cholesterol levels today. - Continue atorvastatin  as prescribed.      Informed Consent   Shared Decision Making/Informed Consent The risks [chest pain, shortness of breath, cardiac arrhythmias, dizziness, blood pressure fluctuations, myocardial infarction, stroke/transient ischemic attack, nausea, vomiting, allergic reaction, radiation exposure, metallic taste sensation and life-threatening complications (estimated to be 1 in 10,000)], benefits (risk stratification, diagnosing coronary artery disease, treatment guidance) and alternatives of a nuclear stress test were discussed in detail with Jocelyn Jackson and she agrees to proceed.       No orders of the defined types were placed in this encounter.    F/u in 8-12 weeks w/APP  Signed, Newman JINNY Lawrence, MD  "

## 2024-08-01 NOTE — Addendum Note (Signed)
 Addended by: Zamani Crocker L on: 08/01/2024 11:14 AM   Modules accepted: Orders

## 2024-08-01 NOTE — Telephone Encounter (Signed)
 Spoke with patient regarding her STRESS TEST scheduled for 08/06/24 at 10:45.  Patient requested for test to be cancelled and she will call back sometime in January to reschedule.

## 2024-08-01 NOTE — Addendum Note (Signed)
 Addended by: ELMIRA NEWMAN PARAS on: 08/01/2024 11:47 AM   Modules accepted: Orders

## 2024-08-02 ENCOUNTER — Ambulatory Visit: Payer: Self-pay | Admitting: Cardiology

## 2024-08-06 ENCOUNTER — Encounter (HOSPITAL_COMMUNITY)

## 2024-08-12 ENCOUNTER — Other Ambulatory Visit (HOSPITAL_COMMUNITY): Payer: Self-pay | Admitting: Internal Medicine

## 2024-08-12 DIAGNOSIS — Z1231 Encounter for screening mammogram for malignant neoplasm of breast: Secondary | ICD-10-CM

## 2024-08-29 ENCOUNTER — Telehealth (HOSPITAL_COMMUNITY): Payer: Self-pay | Admitting: *Deleted

## 2024-08-29 NOTE — Telephone Encounter (Signed)
 Spoke to patient as a reminder about her STRESS TEST on 09/05/24 at 12:45. It was noted in her chart to  arrive at 12:15 because she is also having a ECHOCARDIOGRAM.

## 2024-09-03 ENCOUNTER — Other Ambulatory Visit: Payer: Self-pay | Admitting: Cardiology

## 2024-09-03 DIAGNOSIS — R072 Precordial pain: Secondary | ICD-10-CM

## 2024-09-03 DIAGNOSIS — I2510 Atherosclerotic heart disease of native coronary artery without angina pectoris: Secondary | ICD-10-CM

## 2024-09-05 ENCOUNTER — Ambulatory Visit (HOSPITAL_BASED_OUTPATIENT_CLINIC_OR_DEPARTMENT_OTHER)
Admission: RE | Admit: 2024-09-05 | Discharge: 2024-09-05 | Disposition: A | Source: Ambulatory Visit | Attending: Cardiology

## 2024-09-05 ENCOUNTER — Ambulatory Visit (HOSPITAL_COMMUNITY)
Admission: RE | Admit: 2024-09-05 | Discharge: 2024-09-05 | Disposition: A | Source: Ambulatory Visit | Attending: Cardiology

## 2024-09-05 DIAGNOSIS — I2510 Atherosclerotic heart disease of native coronary artery without angina pectoris: Secondary | ICD-10-CM

## 2024-09-05 DIAGNOSIS — I10 Essential (primary) hypertension: Secondary | ICD-10-CM | POA: Diagnosis not present

## 2024-09-05 DIAGNOSIS — R072 Precordial pain: Secondary | ICD-10-CM | POA: Diagnosis not present

## 2024-09-05 LAB — MYOCARDIAL PERFUSION IMAGING
Angina Index: 0
Base ST Depression (mm): 0 mm
Duke Treadmill Score: 4
Estimated workload: 5.1
Exercise duration (min): 3 min
Exercise duration (sec): 58 s
LV dias vol: 59 mL (ref 46–106)
LV sys vol: 13 mL
MPHR: 147 {beats}/min
Nuc Stress EF: 78 %
Peak HR: 151 {beats}/min
Percent HR: 103 %
Rest HR: 78 {beats}/min
Rest Nuclear Isotope Dose: 10.3 mCi
SDS: 0
SRS: 2
SSS: 0
ST Depression (mm): 0 mm
Stress Nuclear Isotope Dose: 32.7 mCi
TID: 1.13

## 2024-09-05 LAB — ECHOCARDIOGRAM COMPLETE
Area-P 1/2: 3.76 cm2
S' Lateral: 2.2 cm

## 2024-09-05 MED ORDER — TECHNETIUM TC 99M TETROFOSMIN IV KIT
10.3000 | PACK | Freq: Once | INTRAVENOUS | Status: AC | PRN
  Administered 2024-09-05: 10.3 via INTRAVENOUS

## 2024-09-05 MED ORDER — TECHNETIUM TC 99M TETROFOSMIN IV KIT
32.7000 | PACK | Freq: Once | INTRAVENOUS | Status: AC | PRN
  Administered 2024-09-05: 32.7 via INTRAVENOUS

## 2024-09-07 NOTE — Progress Notes (Signed)
 Seeing you later this month. I think the LV basal hypertrophy is more likely to be related to hypertension than true hypertrophic cardiomyopathy. Consider changing ore reducing hydrochlorothiazide  and instead adding a non vasodilatory beta blocker to help with hypertension as well as possibly reducing LV mid cavitary gradient. Metoprolol succinate or atenolol would be good options.  Thanks MJP

## 2024-09-07 NOTE — Progress Notes (Signed)
 Seeing you later this month. Coronary calcification, but no perfusion abnormality noted. If no other indication present, could omit Aspirin, but needs good HTN, DM, HLD control.  Thanks MJP

## 2024-09-08 NOTE — Progress Notes (Signed)
 Got it!     Thanks =).

## 2024-09-15 ENCOUNTER — Ambulatory Visit (HOSPITAL_COMMUNITY)

## 2024-09-30 ENCOUNTER — Ambulatory Visit: Admitting: Physician Assistant
